# Patient Record
Sex: Male | Born: 1975 | Race: White | Hispanic: No | Marital: Married | Smoking: Former smoker
Health system: Southern US, Community
[De-identification: ages and names within clinical notes are randomized; demographics above are authoritative.]

## PROBLEM LIST (undated history)

## (undated) DIAGNOSIS — Z8619 Personal history of other infectious and parasitic diseases: Secondary | ICD-10-CM

## (undated) DIAGNOSIS — M1990 Unspecified osteoarthritis, unspecified site: Secondary | ICD-10-CM

## (undated) DIAGNOSIS — I219 Acute myocardial infarction, unspecified: Secondary | ICD-10-CM

## (undated) DIAGNOSIS — T7840XA Allergy, unspecified, initial encounter: Secondary | ICD-10-CM

## (undated) DIAGNOSIS — K219 Gastro-esophageal reflux disease without esophagitis: Secondary | ICD-10-CM

## (undated) HISTORY — DX: Allergy, unspecified, initial encounter: T78.40XA

## (undated) HISTORY — DX: Gastro-esophageal reflux disease without esophagitis: K21.9

## (undated) HISTORY — DX: Personal history of other infectious and parasitic diseases: Z86.19

## (undated) HISTORY — DX: Unspecified osteoarthritis, unspecified site: M19.90

---

## 1998-02-22 ENCOUNTER — Emergency Department (HOSPITAL_COMMUNITY): Admission: EM | Admit: 1998-02-22 | Discharge: 1998-02-22 | Payer: Self-pay | Admitting: *Deleted

## 2001-08-19 ENCOUNTER — Emergency Department (HOSPITAL_COMMUNITY): Admission: EM | Admit: 2001-08-19 | Discharge: 2001-08-19 | Payer: Self-pay | Admitting: Emergency Medicine

## 2001-08-19 ENCOUNTER — Encounter: Payer: Self-pay | Admitting: Emergency Medicine

## 2002-04-14 ENCOUNTER — Emergency Department (HOSPITAL_COMMUNITY): Admission: EM | Admit: 2002-04-14 | Discharge: 2002-04-14 | Payer: Self-pay | Admitting: *Deleted

## 2004-01-06 ENCOUNTER — Emergency Department (HOSPITAL_COMMUNITY): Admission: EM | Admit: 2004-01-06 | Discharge: 2004-01-06 | Payer: Self-pay | Admitting: Emergency Medicine

## 2008-10-08 HISTORY — PX: COLONOSCOPY: SHX174

## 2009-06-06 ENCOUNTER — Encounter (INDEPENDENT_AMBULATORY_CARE_PROVIDER_SITE_OTHER): Payer: Self-pay | Admitting: *Deleted

## 2009-06-28 ENCOUNTER — Ambulatory Visit (HOSPITAL_COMMUNITY): Admission: RE | Admit: 2009-06-28 | Discharge: 2009-06-28 | Payer: Self-pay | Admitting: Family Medicine

## 2009-07-11 ENCOUNTER — Encounter (INDEPENDENT_AMBULATORY_CARE_PROVIDER_SITE_OTHER): Payer: Self-pay | Admitting: *Deleted

## 2009-07-12 ENCOUNTER — Ambulatory Visit: Payer: Self-pay | Admitting: Internal Medicine

## 2009-07-12 DIAGNOSIS — R1031 Right lower quadrant pain: Secondary | ICD-10-CM | POA: Insufficient documentation

## 2009-07-12 DIAGNOSIS — R634 Abnormal weight loss: Secondary | ICD-10-CM | POA: Insufficient documentation

## 2009-07-12 DIAGNOSIS — K625 Hemorrhage of anus and rectum: Secondary | ICD-10-CM | POA: Insufficient documentation

## 2009-07-12 DIAGNOSIS — R1012 Left upper quadrant pain: Secondary | ICD-10-CM | POA: Insufficient documentation

## 2009-07-12 DIAGNOSIS — R197 Diarrhea, unspecified: Secondary | ICD-10-CM | POA: Insufficient documentation

## 2009-07-13 ENCOUNTER — Encounter: Payer: Self-pay | Admitting: Gastroenterology

## 2009-07-13 ENCOUNTER — Encounter: Payer: Self-pay | Admitting: Internal Medicine

## 2009-07-14 ENCOUNTER — Encounter: Payer: Self-pay | Admitting: Internal Medicine

## 2009-07-14 ENCOUNTER — Encounter: Payer: Self-pay | Admitting: Gastroenterology

## 2009-07-14 LAB — CONVERTED CEMR LAB: IgA: 99 mg/dL (ref 68–378)

## 2009-07-18 ENCOUNTER — Encounter: Payer: Self-pay | Admitting: Internal Medicine

## 2009-07-18 LAB — CONVERTED CEMR LAB
Tissue Transglutaminase Ab, IgA: 0.1 units (ref <7)
Total CK: 60 units/L (ref 7–232)

## 2009-08-01 ENCOUNTER — Ambulatory Visit (HOSPITAL_COMMUNITY): Admission: RE | Admit: 2009-08-01 | Discharge: 2009-08-01 | Payer: Self-pay | Admitting: Internal Medicine

## 2009-08-01 ENCOUNTER — Ambulatory Visit: Payer: Self-pay | Admitting: Internal Medicine

## 2009-08-01 ENCOUNTER — Encounter: Payer: Self-pay | Admitting: Internal Medicine

## 2009-08-07 ENCOUNTER — Encounter: Payer: Self-pay | Admitting: Internal Medicine

## 2009-08-11 ENCOUNTER — Encounter: Payer: Self-pay | Admitting: Internal Medicine

## 2010-10-30 ENCOUNTER — Encounter: Payer: Self-pay | Admitting: Family Medicine

## 2011-01-11 LAB — FECAL LACTOFERRIN, QUANT: Fecal Lactoferrin: NEGATIVE

## 2011-01-11 LAB — CLOSTRIDIUM DIFFICILE EIA

## 2011-01-11 LAB — OVA AND PARASITE EXAMINATION: Ova and parasites: NONE SEEN

## 2011-01-11 LAB — STOOL CULTURE

## 2011-06-29 IMAGING — CT CT ABDOMEN W/ CM
2 of 4 series · 15 of 46 positions shown, 17 images · IV contrast (omnipaque)
Comparison: None

CT ABDOMEN

CLINICAL DATA: Weight loss, diarrhea and blood in stool.

CT ABDOMEN AND PELVIS WITH CONTRAST
TECHNIQUE: Multidetector CT imaging of the abdomen and pelvis was
performed using the standard protocol following bolus
administration of intravenous contrast.
Contrast: 100 mL Omnipaque 300 IV contrast

[Series 2: abd_pel_with 5.0 b40f · axial · 0.66mm/px · z∈[-556,-122]mm · 12 of 103 slices shown, 14 images]
[im 8/103  soft-tissue]
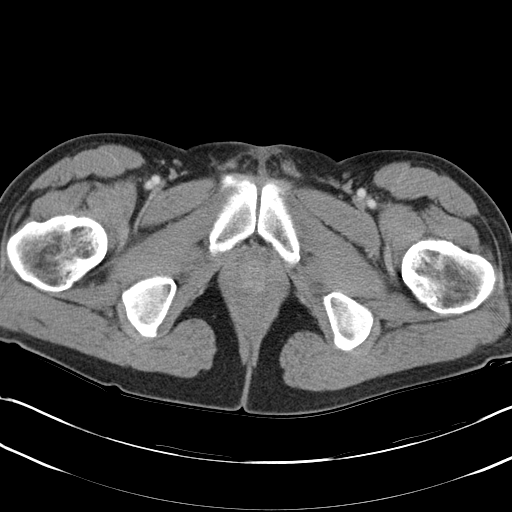
[im 8/103  bone]
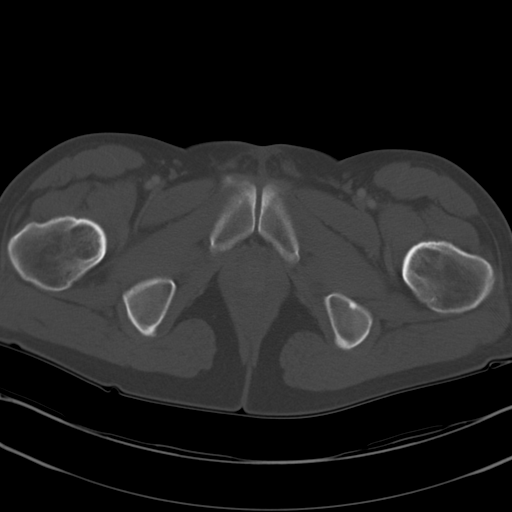
[im 16/103  soft-tissue]
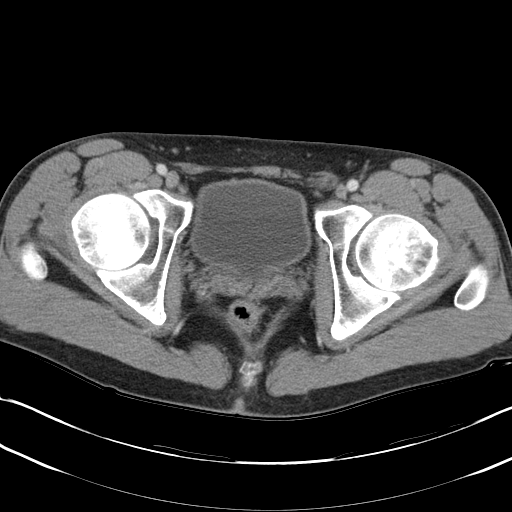
[im 24/103  soft-tissue]
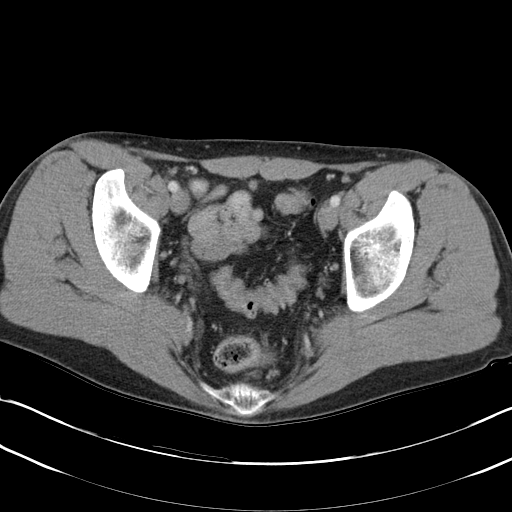
[im 32/103  soft-tissue]
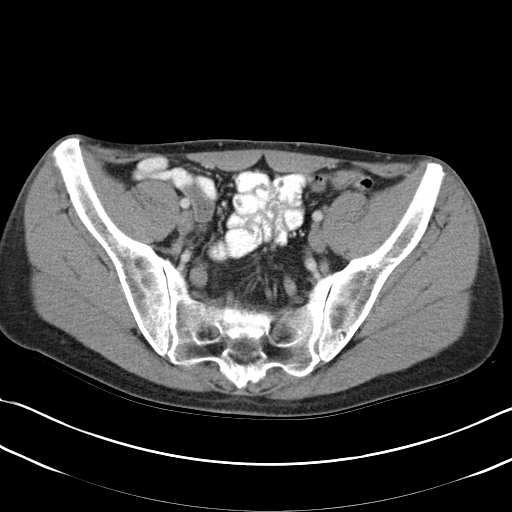
[im 40/103  soft-tissue]
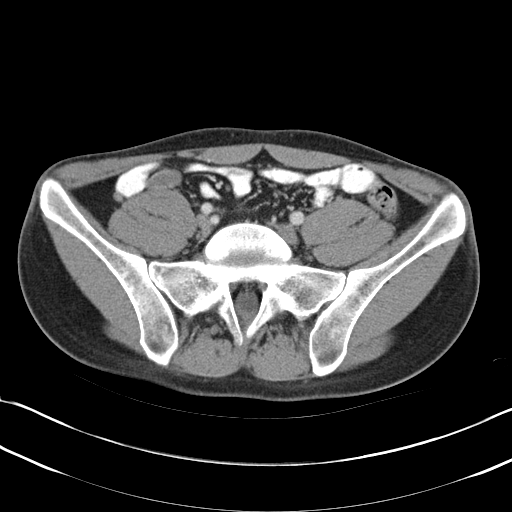
[im 48/103  soft-tissue]
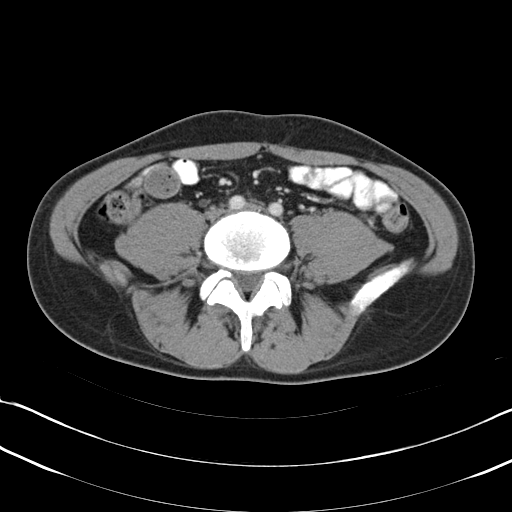
[im 55/103  soft-tissue]
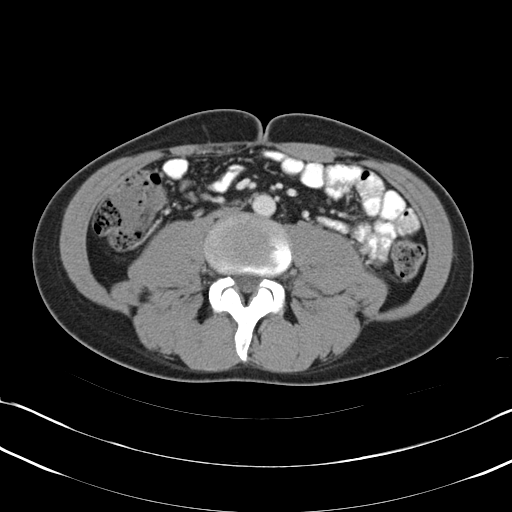
[im 63/103  soft-tissue]
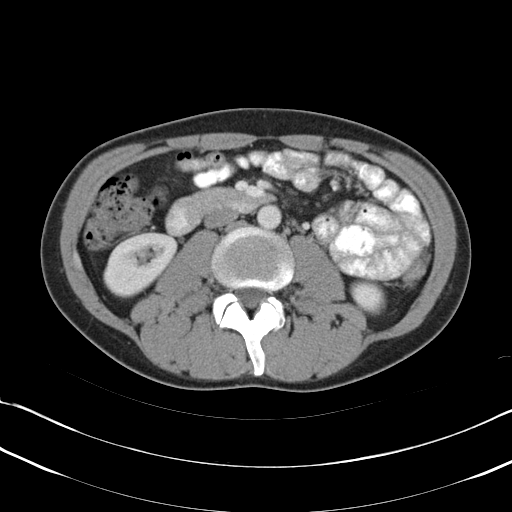
[im 71/103  soft-tissue]
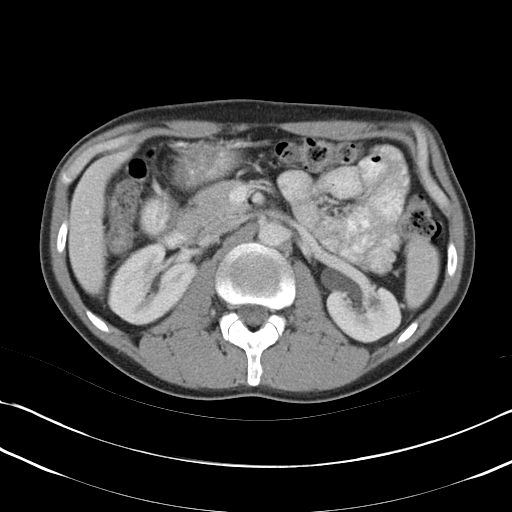
[im 71/103  bone]
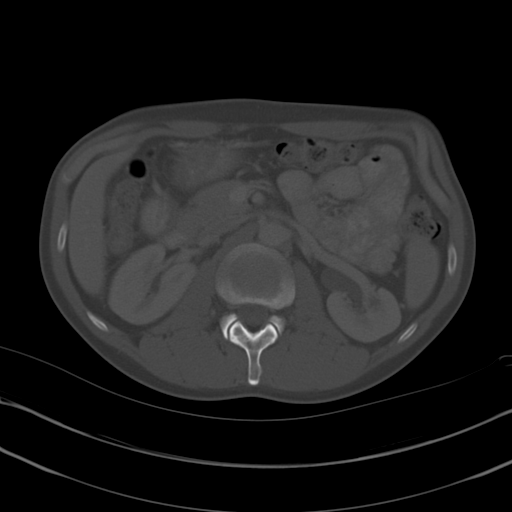
[im 79/103  soft-tissue]
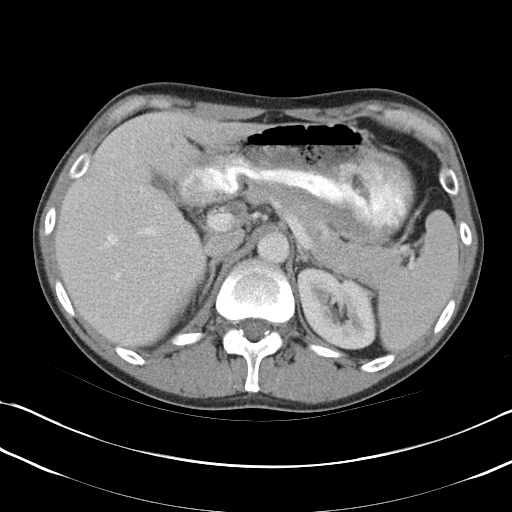
[im 87/103  soft-tissue]
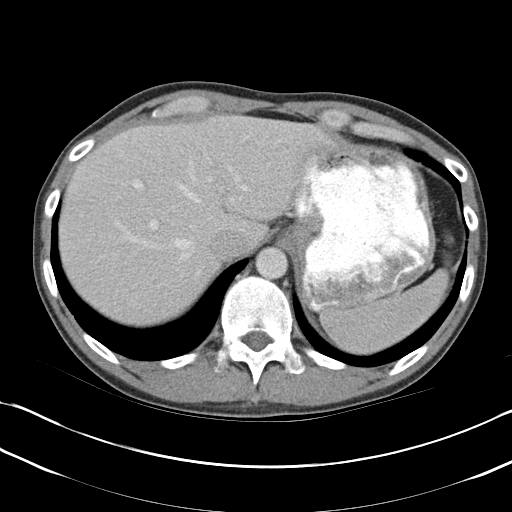
[im 95/103  soft-tissue]
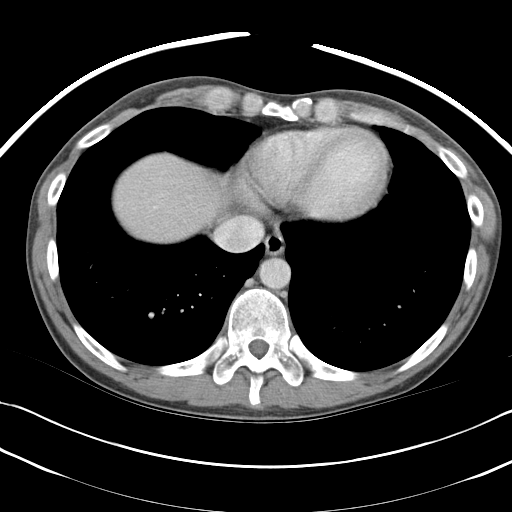

[Series 4: mpr cor post contrast (id) · coronal · 0.65mm/px · 3 of 66 slices shown]
[im 22/66  soft-tissue]
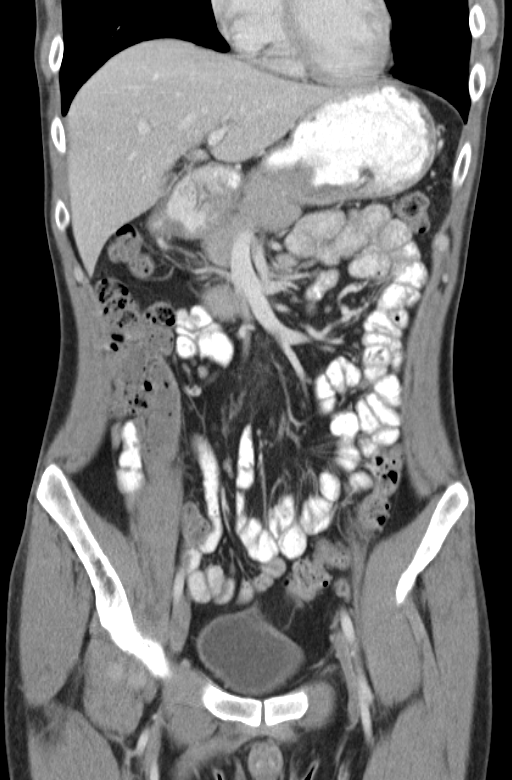
[im 29/66  soft-tissue]
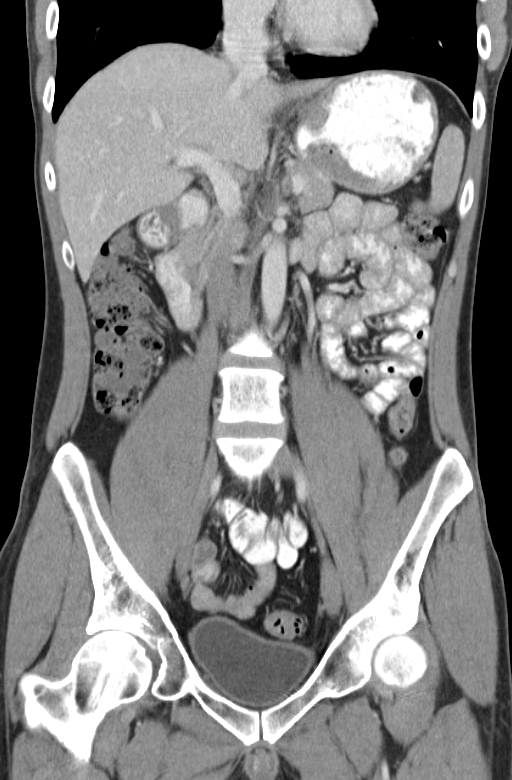
[im 37/66  soft-tissue]
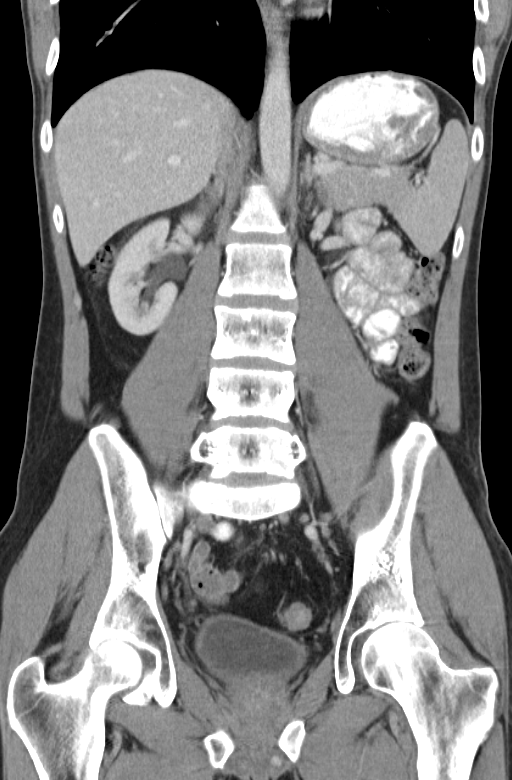

[15 of 46 positions shown; findings below may reference images not displayed]

FINDINGS: The visualized lung bases are clear.

The liver and spleen are unremarkable in appearance.  The
gallbladder is within normal limits.  The pancreas and adrenal
glands are unremarkable.  The kidneys are within normal limits
bilaterally; no hydronephrosis or perinephric stranding is seen. A
small extrarenal pelvis is incidentally noted on each side.

No definite mass is identified.  No free fluid is identified.  The
small bowel is unremarkable in appearance.  The stomach is within
normal limits.  No acute vascular abnormalities are seen.

No acute osseous abnormalities are identified.
IMPRESSION: Unremarkable CT of the abdomen.

CT PELVIS
FINDINGS: The colon is unremarkable in appearance.  No definite
mass is seen.  No abnormal wall thickening is identified.  The
appendix is normal in caliber, without evidence for acute
appendicitis.  No definite abnormalities are seen within the
terminal ileum.  No suspicious lymphadenopathy is seen.  No free
fluid is noted within the pelvis.

The bladder is mildly distended and within normal limits. The
prostate remains normal in size.  There is no evidence of inguinal
lymphadenopathy.

No acute osseous abnormalities are seen.
IMPRESSION: Unremarkable CT of the pelvis.

## 2011-10-09 DIAGNOSIS — I219 Acute myocardial infarction, unspecified: Secondary | ICD-10-CM

## 2011-10-09 HISTORY — DX: Acute myocardial infarction, unspecified: I21.9

## 2012-06-20 ENCOUNTER — Other Ambulatory Visit: Payer: Self-pay | Admitting: Internal Medicine

## 2012-06-20 ENCOUNTER — Encounter: Payer: Self-pay | Admitting: *Deleted

## 2012-06-20 DIAGNOSIS — R079 Chest pain, unspecified: Secondary | ICD-10-CM

## 2012-06-27 ENCOUNTER — Ambulatory Visit (HOSPITAL_COMMUNITY)
Admission: RE | Admit: 2012-06-27 | Discharge: 2012-06-27 | Disposition: A | Discharge status: Home / Self Care | Payer: 59 | Source: Ambulatory Visit | Attending: Internal Medicine | Admitting: Internal Medicine

## 2012-06-27 DIAGNOSIS — R911 Solitary pulmonary nodule: Secondary | ICD-10-CM | POA: Insufficient documentation

## 2012-06-27 DIAGNOSIS — R079 Chest pain, unspecified: Secondary | ICD-10-CM

## 2012-06-27 DIAGNOSIS — R55 Syncope and collapse: Secondary | ICD-10-CM | POA: Insufficient documentation

## 2012-06-27 MED ORDER — NITROGLYCERIN 0.4 MG SL SUBL
SUBLINGUAL_TABLET | SUBLINGUAL | Status: AC
  Administered 2012-06-27: 0.4 mg
  Filled 2012-06-27: qty 25

## 2012-06-27 MED ORDER — METOPROLOL TARTRATE 1 MG/ML IV SOLN
INTRAVENOUS | Status: AC
  Filled 2012-06-27: qty 5

## 2012-06-27 MED ORDER — IOHEXOL 350 MG/ML SOLN
80.0000 mL | Freq: Once | INTRAVENOUS | Status: AC | PRN
  Administered 2012-06-27: 80 mL via INTRAVENOUS

## 2012-06-27 MED ORDER — METOPROLOL TARTRATE 1 MG/ML IV SOLN
5.0000 mg | INTRAVENOUS | Status: DC | PRN
  Administered 2012-06-27: 5 mg via INTRAVENOUS

## 2015-04-10 ENCOUNTER — Encounter (HOSPITAL_COMMUNITY): Payer: Self-pay | Admitting: Emergency Medicine

## 2015-04-10 ENCOUNTER — Emergency Department (HOSPITAL_COMMUNITY): Payer: BLUE CROSS/BLUE SHIELD

## 2015-04-10 ENCOUNTER — Emergency Department (HOSPITAL_COMMUNITY)
Admission: EM | Admit: 2015-04-10 | Discharge: 2015-04-10 | Disposition: A | Discharge status: Home / Self Care | Payer: BLUE CROSS/BLUE SHIELD | Attending: Emergency Medicine | Admitting: Emergency Medicine

## 2015-04-10 DIAGNOSIS — Z79899 Other long term (current) drug therapy: Secondary | ICD-10-CM | POA: Diagnosis not present

## 2015-04-10 DIAGNOSIS — Z87891 Personal history of nicotine dependence: Secondary | ICD-10-CM | POA: Diagnosis not present

## 2015-04-10 DIAGNOSIS — R109 Unspecified abdominal pain: Secondary | ICD-10-CM

## 2015-04-10 DIAGNOSIS — Z880 Allergy status to penicillin: Secondary | ICD-10-CM | POA: Diagnosis not present

## 2015-04-10 DIAGNOSIS — I252 Old myocardial infarction: Secondary | ICD-10-CM | POA: Diagnosis not present

## 2015-04-10 HISTORY — DX: Acute myocardial infarction, unspecified: I21.9

## 2015-04-10 LAB — URINALYSIS, ROUTINE W REFLEX MICROSCOPIC
BILIRUBIN URINE: NEGATIVE
Glucose, UA: NEGATIVE mg/dL
Hgb urine dipstick: NEGATIVE
KETONES UR: NEGATIVE mg/dL
LEUKOCYTES UA: NEGATIVE
Nitrite: NEGATIVE
PROTEIN: NEGATIVE mg/dL
Specific Gravity, Urine: 1.019 (ref 1.005–1.030)
Urobilinogen, UA: 0.2 mg/dL (ref 0.0–1.0)
pH: 6 (ref 5.0–8.0)

## 2015-04-10 LAB — I-STAT TROPONIN, ED: Troponin i, poc: 0 ng/mL (ref 0.00–0.08)

## 2015-04-10 MED ORDER — HYDROCODONE-ACETAMINOPHEN 5-325 MG PO TABS: 1.0000 | ORAL_TABLET | Freq: Four times a day (QID) | ORAL | Status: DC | PRN

## 2015-04-10 MED ORDER — SODIUM CHLORIDE 0.9 % IV SOLN
INTRAVENOUS | Status: DC
  Administered 2015-04-10: 125 mL/h via INTRAVENOUS

## 2015-04-10 MED ORDER — ONDANSETRON HCL 4 MG/2ML IJ SOLN
4.0000 mg | Freq: Once | INTRAMUSCULAR | Status: AC
  Administered 2015-04-10: 4 mg via INTRAVENOUS
  Filled 2015-04-10: qty 2

## 2015-04-10 MED ORDER — HYDROMORPHONE HCL 1 MG/ML IJ SOLN
1.0000 mg | Freq: Once | INTRAMUSCULAR | Status: AC
  Administered 2015-04-10: 1 mg via INTRAVENOUS
  Filled 2015-04-10: qty 1

## 2015-04-10 NOTE — ED Notes (Signed)
Pt reports right lower flank pain starting last night. Familial history of kidney stones. Dysuria noted today. Took 350mg  of Soma and 100 mg Tramadol around 2100 last night. No alleviation of pain. Denies N/V/D. No other c/c.

## 2015-04-10 NOTE — ED Provider Notes (Addendum)
CSN: [id number]     Arrival date & time 04/10/15  0051 History   First MD Initiated Contact with Patient 04/10/15 0205     Chief Complaint  Patient presents with  . Flank Pain     (Consider location/radiation/quality/duration/timing/severity/associated sxs/prior Treatment) HPI  This is a 39 year old male with a family history of kidney stones. He is here with right flank pain that started the evening before yesterday. The onset was gradual and has now become severe. It is somewhat worse with movement but he cannot find a comfortable position. The pain radiates into his right groin. It is a sharp pain. He has had difficulty urinating and has not noted any hematuria. He denies nausea, vomiting and diarrhea. He took 350 milligrams as some 100 milligrams of Ultram yesterday evening about 9 PM without relief.  Past Medical History  Diagnosis Date  . MI (myocardial infarction) 2013   History reviewed. No pertinent past surgical history. History reviewed. No pertinent family history. History  Substance Use Topics  . Smoking status: Former Games developer  . Smokeless tobacco: Not on file  . Alcohol Use: No    Review of Systems  All other systems reviewed and are negative.   Allergies  Penicillins  Home Medications   Prior to Admission medications   Medication Sig Start Date End Date Taking? Authorizing Provider  carisoprodol (SOMA) 350 MG tablet Take 350 mg by mouth 4 (four) times daily as needed for muscle spasms.   Yes Historical Provider, MD  ibuprofen (ADVIL,MOTRIN) 200 MG tablet Take 800 mg by mouth every 6 (six) hours as needed for moderate pain.   Yes Historical Provider, MD  omeprazole (PRILOSEC) 20 MG capsule Take 20 mg by mouth daily.   Yes Historical Provider, MD  traMADol (ULTRAM) 50 MG tablet Take 100 mg by mouth every 6 (six) hours as needed for moderate pain.   Yes Historical Provider, MD   BP 122/67 mmHg  Pulse 56  Temp(Src) 98.6 F (37 C) (Oral)  Resp 18  Ht  (1.88  m)  Wt 200 lb (90.719 kg)  BMI 25.67 kg/m2  SpO2 100%   Physical Exam  General: Well-developed, well-nourished male in no acute distress; appearance consistent with age of record HENT: normocephalic; atraumatic Eyes: pupils equal, round and reactive to light; extraocular muscles intact Neck: supple Heart: regular rate and rhythm Lungs: clear to auscultation bilaterally Abdomen: soft; nondistended; equivocal right lower quadrant tenderness; no masses or hepatosplenomegaly; bowel sounds present GU: Mild right CVA tenderness Extremities: No deformity; full range of motion; pulses normal Neurologic: Awake, alert and oriented; motor function intact in all extremities and symmetric; no facial droop Skin: Warm and dry Psychiatric: Normal mood and affect    ED Course  Procedures (including critical care time)   MDM  Nursing notes and vitals signs, including pulse oximetry, reviewed.  Summary of this visit's results, reviewed by myself:  Labs:  Results for orders placed or performed during the hospital encounter of 04/10/15 (from the past 24 hour(s))  I-Stat Troponin, ED (not at [facility])     Status: None   Collection Time: 04/10/15  2:08 AM  Result Value Ref Range   Troponin i, poc 0.00 0.00 - 0.08 ng/mL   Comment 3          Urinalysis, Routine w reflex microscopic (not at [facility])     Status: None   Collection Time: 04/10/15  4:10 AM  Result Value Ref Range   Color, Urine YELLOW YELLOW  APPearance CLEAR CLEAR   Specific Gravity, Urine 1.019 1.005 - 1.030   pH 6.0 5.0 - 8.0   Glucose, UA NEGATIVE NEGATIVE mg/dL   Hgb urine dipstick NEGATIVE NEGATIVE   Bilirubin Urine NEGATIVE NEGATIVE   Ketones, ur NEGATIVE NEGATIVE mg/dL   Protein, ur NEGATIVE NEGATIVE mg/dL   Urobilinogen, UA 0.2 0.0 - 1.0 mg/dL   Nitrite NEGATIVE NEGATIVE   Leukocytes, UA NEGATIVE NEGATIVE    Imaging Studies: Ct Renal Stone Study  04/10/2015   CLINICAL DATA:  Right flank pain.  Onset last night.  EXAM: CT  ABDOMEN AND PELVIS WITHOUT CONTRAST  TECHNIQUE: Multidetector CT imaging of the abdomen and pelvis was performed following the standard protocol without IV contrast.  COMPARISON:  None.  FINDINGS: There are no urinary calculi. There is no hydronephrosis or ureteral dilatation. The appendix is normal. Remainder the bowel is remarkable only for mild colonic diverticulosis. There are unremarkable unenhanced appearances of the liver, spleen, pancreas and adrenals. The abdominal aorta is normal in caliber. There is no atherosclerotic calcification. There is no adenopathy in the abdomen or pelvis.  No acute inflammatory changes are evident in the abdomen or pelvis. There is no ascites.  There is no significant abnormality in the lower chest. There is no significant musculoskeletal abnormality.  IMPRESSION: 1. No acute findings are evident in the abdomen or pelvis. 2. Mild diverticulosis   Electronically Signed   By: Ellery Plunk M.D.   On: 04/10/2015 02:54   6:45 AM Patient advised of unremarkable CT and urinalysis findings. Presentation was suggestive of a kidney stone, and pain may represent a recently passed stone, but more likely this is musculoskeletal or radicular pain. He states his pain is returning at the present time and is worse with movement. He is tender below the usual location of kidney tenderness. He states he has a primary care physician with whom he can follow-up if symptoms persist. He was advised an MRI may be indicated to evaluate for radicular disease.  Paula Libra, MD 04/10/15 [id number]  Paula Libra, MD 04/10/15 [phone]

## 2015-04-10 NOTE — ED Notes (Signed)
Patient transported to CT 

## 2015-04-10 NOTE — ED Notes (Addendum)
Pt cannot use rest room at this time, aware urine sample is needed 

## 2015-04-10 NOTE — Discharge Instructions (Signed)
Flank Pain °Flank pain refers to pain that is located on the side of the body between the upper abdomen and the back. The pain may occur over a short period of time (acute) or may be long-term or reoccurring (chronic). It may be mild or severe. Flank pain can be caused by many things. °CAUSES  °Some of the more common causes of flank pain include: °· Muscle strains.   °· Muscle spasms.   °· A disease of your spine (vertebral disk disease).   °· A lung infection (pneumonia).   °· Fluid around your lungs (pulmonary edema).   °· A kidney infection.   °· Kidney stones.   °· A very painful skin rash caused by the chickenpox virus (shingles).   °· Gallbladder disease.   °HOME CARE INSTRUCTIONS  °Home care will depend on the cause of your pain. In general, °· Rest as directed by your caregiver. °· Drink enough fluids to keep your urine clear or pale yellow. °· Only take over-the-counter or prescription medicines as directed by your caregiver. Some medicines may help relieve the pain. °· Tell your caregiver about any changes in your pain. °· Follow up with your caregiver as directed. °SEEK IMMEDIATE MEDICAL CARE IF:  °· Your pain is not controlled with medicine.   °· You have new or worsening symptoms. °· Your pain increases.   °· You have abdominal pain.   °· You have shortness of breath.   °· You have persistent nausea or vomiting.   °· You have swelling in your abdomen.   °· You feel faint or pass out.   °· You have blood in your urine. °· You have a fever or persistent symptoms for more than 2-3 days. °· You have a fever and your symptoms suddenly get worse. °MAKE SURE YOU:  °· Understand these instructions. °· Will watch your condition. °· Will get help right away if you are not doing well or get worse. °Document Released: 11/15/2005 Document Revised: 06/18/2012 Document Reviewed: 05/08/2012 °ExitCare® Patient Information ©2015 [employer]. This information is not intended to replace advice given to you by your  health care provider. Make sure you discuss any questions you have with your health care provider. ° °

## 2015-05-17 ENCOUNTER — Ambulatory Visit (HOSPITAL_COMMUNITY)
Admission: RE | Admit: 2015-05-17 | Discharge: 2015-05-17 | Disposition: A | Discharge status: Home / Self Care | Payer: BLUE CROSS/BLUE SHIELD | Source: Ambulatory Visit | Attending: Orthopedic Surgery | Admitting: Orthopedic Surgery

## 2015-05-17 ENCOUNTER — Other Ambulatory Visit (HOSPITAL_COMMUNITY): Payer: Self-pay | Admitting: Orthopedic Surgery

## 2015-05-17 DIAGNOSIS — Z139 Encounter for screening, unspecified: Secondary | ICD-10-CM

## 2015-05-17 DIAGNOSIS — Z1389 Encounter for screening for other disorder: Secondary | ICD-10-CM | POA: Insufficient documentation

## 2018-12-12 DIAGNOSIS — M79642 Pain in left hand: Secondary | ICD-10-CM | POA: Diagnosis not present

## 2018-12-12 DIAGNOSIS — M79641 Pain in right hand: Secondary | ICD-10-CM | POA: Diagnosis not present

## 2019-02-07 DIAGNOSIS — S61311A Laceration without foreign body of left index finger with damage to nail, initial encounter: Secondary | ICD-10-CM | POA: Diagnosis not present

## 2019-02-07 DIAGNOSIS — Z87891 Personal history of nicotine dependence: Secondary | ICD-10-CM | POA: Diagnosis not present

## 2019-02-07 DIAGNOSIS — Z23 Encounter for immunization: Secondary | ICD-10-CM | POA: Diagnosis not present

## 2019-02-07 DIAGNOSIS — S6992XA Unspecified injury of left wrist, hand and finger(s), initial encounter: Secondary | ICD-10-CM | POA: Diagnosis not present

## 2019-02-07 DIAGNOSIS — S61211A Laceration without foreign body of left index finger without damage to nail, initial encounter: Secondary | ICD-10-CM | POA: Diagnosis not present

## 2019-02-07 DIAGNOSIS — G8911 Acute pain due to trauma: Secondary | ICD-10-CM | POA: Diagnosis not present

## 2019-02-07 DIAGNOSIS — W312XXA Contact with powered woodworking and forming machines, initial encounter: Secondary | ICD-10-CM | POA: Diagnosis not present

## 2019-02-07 DIAGNOSIS — S62661B Nondisplaced fracture of distal phalanx of left index finger, initial encounter for open fracture: Secondary | ICD-10-CM | POA: Diagnosis not present

## 2019-02-07 DIAGNOSIS — Z880 Allergy status to penicillin: Secondary | ICD-10-CM | POA: Diagnosis not present

## 2019-02-07 DIAGNOSIS — I252 Old myocardial infarction: Secondary | ICD-10-CM | POA: Diagnosis not present

## 2019-02-09 DIAGNOSIS — M79642 Pain in left hand: Secondary | ICD-10-CM | POA: Diagnosis not present

## 2019-02-19 DIAGNOSIS — M79642 Pain in left hand: Secondary | ICD-10-CM | POA: Diagnosis not present

## 2019-03-12 DIAGNOSIS — M79642 Pain in left hand: Secondary | ICD-10-CM | POA: Diagnosis not present

## 2019-10-20 DIAGNOSIS — J019 Acute sinusitis, unspecified: Secondary | ICD-10-CM | POA: Diagnosis not present

## 2020-04-22 DIAGNOSIS — M1712 Unilateral primary osteoarthritis, left knee: Secondary | ICD-10-CM | POA: Diagnosis not present

## 2020-05-26 ENCOUNTER — Ambulatory Visit: Payer: Self-pay | Admitting: Family Medicine

## 2020-06-17 ENCOUNTER — Telehealth: Payer: Self-pay | Admitting: Physician Assistant

## 2020-06-17 ENCOUNTER — Ambulatory Visit (INDEPENDENT_AMBULATORY_CARE_PROVIDER_SITE_OTHER): Payer: BC Managed Care – PPO | Admitting: Physician Assistant

## 2020-06-17 ENCOUNTER — Other Ambulatory Visit: Payer: Self-pay

## 2020-06-17 ENCOUNTER — Encounter: Payer: Self-pay | Admitting: Physician Assistant

## 2020-06-17 VITALS — BP 140/90 | HR 77 | Temp 97.8°F | Ht 73.5 in | Wt 209.2 lb

## 2020-06-17 DIAGNOSIS — K529 Noninfective gastroenteritis and colitis, unspecified: Secondary | ICD-10-CM

## 2020-06-17 DIAGNOSIS — B0230 Zoster ocular disease, unspecified: Secondary | ICD-10-CM | POA: Diagnosis not present

## 2020-06-17 DIAGNOSIS — I219 Acute myocardial infarction, unspecified: Secondary | ICD-10-CM | POA: Diagnosis not present

## 2020-06-17 DIAGNOSIS — M1990 Unspecified osteoarthritis, unspecified site: Secondary | ICD-10-CM | POA: Insufficient documentation

## 2020-06-17 DIAGNOSIS — K219 Gastro-esophageal reflux disease without esophagitis: Secondary | ICD-10-CM | POA: Diagnosis not present

## 2020-06-17 DIAGNOSIS — Z0000 Encounter for general adult medical examination without abnormal findings: Secondary | ICD-10-CM

## 2020-06-17 DIAGNOSIS — Z1159 Encounter for screening for other viral diseases: Secondary | ICD-10-CM | POA: Diagnosis not present

## 2020-06-17 DIAGNOSIS — Z114 Encounter for screening for human immunodeficiency virus [HIV]: Secondary | ICD-10-CM

## 2020-06-17 DIAGNOSIS — F5109 Other insomnia not due to a substance or known physiological condition: Secondary | ICD-10-CM

## 2020-06-17 DIAGNOSIS — Z8042 Family history of malignant neoplasm of prostate: Secondary | ICD-10-CM

## 2020-06-17 MED ORDER — VALACYCLOVIR HCL 1 G PO TABS: 1000.0000 mg | ORAL_TABLET | Freq: Every day | ORAL | 1 refills | Status: DC

## 2020-06-17 MED ORDER — OMEPRAZOLE 20 MG PO CPDR: 20.0000 mg | DELAYED_RELEASE_CAPSULE | Freq: Every day | ORAL | 1 refills | Status: DC

## 2020-06-17 NOTE — Progress Notes (Signed)
Garrett Caldwell is a 44 y.o. male here to Establish care and annual physical.  I acted as a Neurosurgeon for [employer], PA-C Corky Mull, LPN   History of Present Illness:   Chief Complaint  Patient presents with  . Establish Care  . Annual Exam    Acute Concerns: None  Chronic Issues: MI -- happened in 2013. He was admitted and saw cardiology. States that he was told multiple different things by different cardiologist.  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 Takes 1 gram valtrex daily for this. GERD -- currently takes prilosec 20 mg daily. He does well with this. Has been on this for several years. Insomnia -- was situational after dad died. Will occassionally take this. Colitis -- was told that he possibly has Crohns. Saw [facility] in 2010. Manages his symptoms with his diet. Has had colonoscopy and EGD.  Health Maintenance: Immunizations -- UTD Colonoscopy -- N/A PSA -- done last year at [facility] -- said "slightly elevated" I do not have record of this Diet -- variable; does drink significant amount of juices and kool-aid Sleep habits -- will take tyelnol pm and Exercise -- none Weight -- Weight: 209 lb 4 oz (94.9 kg)  Mood -- overall good Alcohol -- none Tobacco -- former smoker  Depression screen [facility] 2/9 06/17/2020  Decreased Interest 0  Down, Depressed, Hopeless 0  PHQ - 2 Score 0    No flowsheet data found.   Other providers/specialists: Patient Care Team: Jarold Motto, [place] as PCP - General (Physician Assistant)   Past Medical History:  Diagnosis Date  . Allergy   . Arthritis   . GERD (gastroesophageal reflux disease)   . History of chicken pox   . History of shingles   . MI (myocardial infarction) (HCC) 2013     Social History   Tobacco Use  . Smoking status: Former Games developer  . Smokeless tobacco: Never Used  . Tobacco comment: quit 2017  Vaping Use  . Vaping Use: Never used  Substance Use Topics  . Alcohol  use: Never  . Drug use: Never    History reviewed. No pertinent surgical history.  Family History  Problem Relation Age of Onset  . Prostate cancer Father        mets to body    Allergies  Allergen Reactions  . Bee Venom Anaphylaxis  . Penicillins Hives and Swelling     Current Medications:   Current Outpatient Medications:  .  ibuprofen (ADVIL,MOTRIN) 200 MG tablet, Take 800 mg by mouth every 6 (six) hours as needed for moderate pain., Disp: , Rfl:  .  omeprazole (PRILOSEC) 20 MG capsule, Take 1 capsule (20 mg total) by mouth daily., Disp: 90 capsule, Rfl: 1 .  traMADol (ULTRAM) 50 MG tablet, tramadol 50 mg tablet, Disp: , Rfl:  .  traZODone (DESYREL) 50 MG tablet, trazodone 50 mg tablet, Disp: , Rfl:  .  valACYclovir (VALTREX) 1000 MG tablet, Take 1 tablet (1,000 mg total) by mouth daily., Disp: 90 tablet, Rfl: 1   Review of Systems:   Review of Systems  Constitutional: Negative for chills, fever, malaise/fatigue and weight loss.  HENT: Negative for hearing loss, sinus pain and sore throat.   Respiratory: Negative for cough and hemoptysis.   Cardiovascular: Negative for chest pain, palpitations, leg swelling and PND.  Gastrointestinal: Negative for abdominal pain, constipation, diarrhea, heartburn, nausea and vomiting.  Genitourinary: Negative for dysuria, frequency and urgency.  Musculoskeletal: Negative  for back pain, myalgias and neck pain.  Skin: Negative for itching and rash.  Neurological: Negative for dizziness, tingling, seizures and headaches.  Endo/Heme/Allergies: Negative for polydipsia.  Psychiatric/Behavioral: Negative for depression. The patient is not nervous/anxious.     Vitals:   Vitals:   06/17/20 0944  BP: 140/90  Pulse: 77  Temp: 97.8 F (36.6 C)  TempSrc: Temporal  SpO2: 99%  Weight: 209 lb 4 oz (94.9 kg)  Height: 6' 1.5" (1.867 m)      Body mass index is 27.23 kg/m.  Physical Exam:   Physical Exam Vitals and nursing note reviewed.   Constitutional:      General: He is not in acute distress.    Appearance: Normal appearance. He is well-developed. He is not ill-appearing or toxic-appearing.  HENT:     Head: Normocephalic and atraumatic.     Right Ear: Tympanic membrane normal. Tympanic membrane is not erythematous, retracted or bulging.     Left Ear: Tympanic membrane normal. Tympanic membrane is not erythematous, retracted or bulging.     Nose: Nose normal.     Mouth/Throat:     Pharynx: Uvula midline.  Eyes:     General: Lids are normal.     Conjunctiva/sclera: Conjunctivae normal.     Pupils: Pupils are equal, round, and reactive to light.  Neck:     Trachea: Trachea normal.  Cardiovascular:     Rate and Rhythm: Normal rate and regular rhythm.     Pulses: Normal pulses.     Heart sounds: Normal heart sounds, S1 normal and S2 normal.  Pulmonary:     Effort: Pulmonary effort is normal.     Breath sounds: Normal breath sounds.  Abdominal:     General: Bowel sounds are normal.     Tenderness: There is no abdominal tenderness.  Lymphadenopathy:     Cervical: No cervical adenopathy.  Skin:    General: Skin is warm and dry.  Neurological:     Mental Status: He is alert.  Psychiatric:        Speech: Speech normal.        Behavior: Behavior normal. Behavior is cooperative.        Thought Content: Thought content normal.     Assessment and Plan:   Sally was seen today for establish care and annual exam.  Diagnoses and all orders for this visit:  Routine physical examination Today patient counseled on age appropriate routine health concerns for screening and prevention, each reviewed and up to date or declined. Immunizations reviewed and up to date or declined. Labs ordered and reviewed. Risk factors for depression reviewed and negative. Hearing function and visual acuity are intact. ADLs screened and addressed as needed. Functional ability and level of safety reviewed and appropriate. Education,  counseling and referrals performed based on assessed risks today. Patient provided with a copy of personalized plan for preventive services.  Myocardial infarction, unspecified MI type, unspecified artery (HCC) Declines referral to cardiology for routine follow-up. Will update lipid panel today. Denies symptoms. Understands need to follow-up ASAP if symptoms develop. 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 Courtesy refill of Valtrex given today.  Gastroesophageal reflux disease, unspecified whether esophagitis  present Controlled with Prilosec. No red flags. Refill today.  Inflammatory bowel disease Declines GI work-up. Understands that he needs colonoscopy at age 45. Continue to monitor.  Situational insomnia Trazodone prn works well for him. Continue to monitor.  Family history of prostate cancer No symptoms currently. Will update PSA. Requesting prior PSA from prior PCP. -     PSA; Future -     PSA  Screening for HIV (human immunodeficiency virus) -     HIV Antibody (routine testing w rflx); Future -     HIV Antibody (routine testing w rflx)  Encounter for screening for other viral diseases -     Hepatitis C antibody; Future -     Hepatitis C antibody  Other orders -     valACYclovir (VALTREX) 1000 MG tablet; Take 1 tablet (1,000 mg total) by mouth daily. -     omeprazole (PRILOSEC) 20 MG capsule; Take 1 capsule (20 mg total) by mouth daily.  . Reviewed expectations re: course of current medical issues. . Discussed self-management of symptoms. . Outlined signs and symptoms indicating need for more acute intervention. . Patient verbalized understanding and all questions were answered. . See orders for this visit as documented in the  electronic medical record. . Patient received an After-Visit Summary.  CMA or LPN served as scribe during this visit. History, Physical, and Plan performed by medical provider. The above documentation has been reviewed and is accurate and complete.   Jarold Motto, PA-C

## 2020-06-17 NOTE — Telephone Encounter (Signed)
Rx's were sent in as 90 day supply.

## 2020-06-17 NOTE — Patient Instructions (Signed)
It was great to see you!  Please go to the lab for blood work.   Refills for Valtrex and Omeprazole have been sent.  Our office will call you with your results unless you have chosen to receive results via MyChart.  If your blood work is normal we will follow-up each year for physicals and as scheduled for chronic medical problems.  If anything is abnormal we will treat accordingly and get you in for a follow-up.  Take care,  [facility] Maintenance, Male Adopting a healthy lifestyle and getting preventive care are important in promoting health and wellness. Ask your health care provider about:  The right schedule for you to have regular tests and exams.  Things you can do on your own to prevent diseases and keep yourself healthy. What should I know about diet, weight, and exercise? Eat a healthy diet   Eat a diet that includes plenty of vegetables, fruits, low-fat dairy products, and lean protein.  Do not eat a lot of foods that are high in solid fats, added sugars, or sodium. Maintain a healthy weight Body mass index (BMI) is a measurement that can be used to identify possible weight problems. It estimates body fat based on height and weight. Your health care provider can help determine your BMI and help you achieve or maintain a healthy weight. Get regular exercise Get regular exercise. This is one of the most important things you can do for your health. Most adults should:  Exercise for at least 150 minutes each week. The exercise should increase your heart rate and make you sweat (moderate-intensity exercise).  Do strengthening exercises at least twice a week. This is in addition to the moderate-intensity exercise.  Spend less time sitting. Even light physical activity can be beneficial. Watch cholesterol and blood lipids Have your blood tested for lipids and cholesterol at 46 years of age, then have this test every 5 years. You may need to have your cholesterol  levels checked more often if:  Your lipid or cholesterol levels are high.  You are older than 46 years of age.  You are at high risk for heart disease. What should I know about cancer screening? Many types of cancers can be detected early and may often be prevented. Depending on your health history and family history, you may need to have cancer screening at various ages. This may include screening for:  Colorectal cancer.  Prostate cancer.  Skin cancer.  Lung cancer. What should I know about heart disease, diabetes, and high blood pressure? Blood pressure and heart disease  High blood pressure causes heart disease and increases the risk of stroke. This is more likely to develop in people who have high blood pressure readings, are of African descent, or are overweight.  Talk with your health care provider about your target blood pressure readings.  Have your blood pressure checked: ? Every 3-5 years if you are 26-51 years of age. ? Every year if you are 48 years old or older.  If you are between the ages of 69 and 49 and are a current or former smoker, ask your health care provider if you should have a one-time screening for abdominal aortic aneurysm (AAA). Diabetes Have regular diabetes screenings. This checks your fasting blood sugar level. Have the screening done:  Once every three years after age 6 if you are at a normal weight and have a low risk for diabetes.  More often and at a younger age  if you are overweight or have a high risk for diabetes. What should I know about preventing infection? Hepatitis B If you have a higher risk for hepatitis B, you should be screened for this virus. Talk with your health care provider to find out if you are at risk for hepatitis B infection. Hepatitis C Blood testing is recommended for:  Everyone born from [birth year] through 1967.  Anyone with known risk factors for hepatitis C. Sexually transmitted infections (STIs)  You should be  screened each year for STIs, including gonorrhea and chlamydia, if: ? You are sexually active and are younger than 46 years of age. ? You are older than 46 years of age and your health care provider tells you that you are at risk for this type of infection. ? Your sexual activity has changed since you were last screened, and you are at increased risk for chlamydia or gonorrhea. Ask your health care provider if you are at risk.  Ask your health care provider about whether you are at high risk for HIV. Your health care provider may recommend a prescription medicine to help prevent HIV infection. If you choose to take medicine to prevent HIV, you should first get tested for HIV. You should then be tested every 3 months for as long as you are taking the medicine. Follow these instructions at home: Lifestyle  Do not use any products that contain nicotine or tobacco, such as cigarettes, e-cigarettes, and chewing tobacco. If you need help quitting, ask your health care provider.  Do not use street drugs.  Do not share needles.  Ask your health care provider for help if you need support or information about quitting drugs. Alcohol use  Do not drink alcohol if your health care provider tells you not to drink.  If you drink alcohol: ? Limit how much you have to 0-2 drinks a day. ? Be aware of how much alcohol is in your drink. In the [place] one drink equals one 12 oz bottle of beer (355 mL), one 5 oz glass of wine (148 mL), or one 1 oz glass of hard liquor (44 mL). General instructions  Schedule regular health, dental, and eye exams.  Stay current with your vaccines.  Tell your health care provider if: ? You often feel depressed. ? You have ever been abused or do not feel safe at home. Summary  Adopting a healthy lifestyle and getting preventive care are important in promoting health and wellness.  Follow your health care provider's instructions about healthy diet, exercising, and getting  tested or screened for diseases.  Follow your health care provider's instructions on monitoring your cholesterol and blood pressure. This information is not intended to replace advice given to you by your health care provider. Make sure you discuss any questions you have with your health care provider. Document Revised: 09/17/2018 Document Reviewed: 09/17/2018 Elsevier Patient Education  2020 [employer].

## 2020-06-17 NOTE — Telephone Encounter (Signed)
Patient states his insurance will only cover 90 day supply instead of 30 day supply when filling his RXs

## 2020-06-20 LAB — HIV ANTIBODY (ROUTINE TESTING W REFLEX): HIV 1&2 Ab, 4th Generation: NONREACTIVE

## 2020-06-20 LAB — COMPREHENSIVE METABOLIC PANEL
AG Ratio: 2.1 (calc) (ref 1.0–2.5)
ALT: 15 U/L (ref 9–46)
AST: 16 U/L (ref 10–40)
Albumin: 4.4 g/dL (ref 3.6–5.1)
Alkaline phosphatase (APISO): 99 U/L (ref 36–130)
BUN: 12 mg/dL (ref 7–25)
CO2: 30 mmol/L (ref 20–32)
Calcium: 9.4 mg/dL (ref 8.6–10.3)
Chloride: 105 mmol/L (ref 98–110)
Creat: 1.09 mg/dL (ref 0.60–1.35)
Globulin: 2.1 g/dL (calc) (ref 1.9–3.7)
Glucose, Bld: 84 mg/dL (ref 65–99)
Potassium: 4.3 mmol/L (ref 3.5–5.3)
Sodium: 143 mmol/L (ref 135–146)
Total Bilirubin: 0.4 mg/dL (ref 0.2–1.2)
Total Protein: 6.5 g/dL (ref 6.1–8.1)

## 2020-06-20 LAB — CBC WITH DIFFERENTIAL/PLATELET
Absolute Monocytes: 743 cells/uL (ref 200–950)
Basophils Absolute: 72 cells/uL (ref 0–200)
Basophils Relative: 1.3 %
Eosinophils Absolute: 72 cells/uL (ref 15–500)
Eosinophils Relative: 1.3 %
HCT: 43.8 % (ref 38.5–50.0)
Hemoglobin: 14.3 g/dL (ref 13.2–17.1)
Lymphs Abs: 1777 cells/uL (ref 850–3900)
MCH: 30 pg (ref 27.0–33.0)
MCHC: 32.6 g/dL (ref 32.0–36.0)
MCV: 92 fL (ref 80.0–100.0)
MPV: 12 fL (ref 7.5–12.5)
Monocytes Relative: 13.5 %
Neutro Abs: 2838 cells/uL (ref 1500–7800)
Neutrophils Relative %: 51.6 %
Platelets: 194 10*3/uL (ref 140–400)
RBC: 4.76 10*6/uL (ref 4.20–5.80)
RDW: 12.1 % (ref 11.0–15.0)
Total Lymphocyte: 32.3 %
WBC: 5.5 10*3/uL (ref 3.8–10.8)

## 2020-06-20 LAB — LIPID PANEL
Cholesterol: 222 mg/dL — ABNORMAL HIGH (ref <200)
HDL: 44 mg/dL (ref >=40)
LDL Cholesterol (Calc): 152 mg/dL (calc) — ABNORMAL HIGH
Non-HDL Cholesterol (Calc): 178 mg/dL (calc) — ABNORMAL HIGH (ref <130)
Total CHOL/HDL Ratio: 5 (calc) — ABNORMAL HIGH (ref <5.0)
Triglycerides: 140 mg/dL (ref <150)

## 2020-06-20 LAB — PSA: PSA: 0.7 ng/mL (ref <=4.0)

## 2020-06-20 LAB — HEPATITIS C ANTIBODY
Hepatitis C Ab: NONREACTIVE
SIGNAL TO CUT-OFF: 0 (ref <1.00)

## 2020-07-14 ENCOUNTER — Telehealth: Payer: Self-pay

## 2020-07-14 NOTE — Telephone Encounter (Signed)
Patient's wife is calling in asking if someone could give them a call about lab results as Hessie Diener did not understand, did speak with husband and did advise per DPR that we would not be able to speak about his results so he asked if someone could call him.

## 2020-07-15 NOTE — Telephone Encounter (Signed)
Called pt asked him how can I help you. Pt said you called me. Told him you called about lab results. Pt said need to talk to my wife I told them that yesterday. Told him she is not on DPR do you give me verbal permission to talk to her. Pt said yes.

## 2020-07-15 NOTE — Telephone Encounter (Signed)
Tried to call wife on home # not working at present.

## 2020-07-27 MED ORDER — PRAVASTATIN SODIUM 20 MG PO TABS: 20.0000 mg | ORAL_TABLET | Freq: Every day | ORAL | 1 refills | Status: DC

## 2020-07-27 NOTE — Addendum Note (Signed)
Addended by: Jimmye Norman on: 07/27/2020 08:51 AM   Modules accepted: Orders

## 2020-07-27 NOTE — Telephone Encounter (Signed)
Spoke to pt's wife Lebron Conners, told her pt wanted me to discuss cholesterol medication with you. Lelon Mast would like to start him on Lipitor 40 mg daily. Lebron Conners said pt did try that after his heart attack and had to stop due to nausea everyday. Told her okay hang on. Told her discussed with Lelon Mast and we are going to start him on Pravachol 20 mg daily. If he has any problems please let us know. Lebron Conners verbalized understanding. Rx sent to pharmacy.

## 2020-08-11 DIAGNOSIS — B0231 Zoster conjunctivitis: Secondary | ICD-10-CM | POA: Diagnosis not present

## 2020-10-13 ENCOUNTER — Other Ambulatory Visit: Payer: Self-pay | Admitting: Physician Assistant

## 2020-11-28 DIAGNOSIS — H179 Unspecified corneal scar and opacity: Secondary | ICD-10-CM | POA: Diagnosis not present

## 2020-11-28 DIAGNOSIS — H04123 Dry eye syndrome of bilateral lacrimal glands: Secondary | ICD-10-CM | POA: Diagnosis not present

## 2020-11-29 DIAGNOSIS — H109 Unspecified conjunctivitis: Secondary | ICD-10-CM | POA: Diagnosis not present

## 2020-12-10 DIAGNOSIS — R059 Cough, unspecified: Secondary | ICD-10-CM | POA: Diagnosis not present

## 2020-12-10 DIAGNOSIS — Z20822 Contact with and (suspected) exposure to covid-19: Secondary | ICD-10-CM | POA: Diagnosis not present

## 2020-12-10 DIAGNOSIS — J029 Acute pharyngitis, unspecified: Secondary | ICD-10-CM | POA: Diagnosis not present

## 2020-12-10 DIAGNOSIS — R509 Fever, unspecified: Secondary | ICD-10-CM | POA: Diagnosis not present

## 2020-12-11 DIAGNOSIS — R6889 Other general symptoms and signs: Secondary | ICD-10-CM | POA: Diagnosis not present

## 2020-12-11 DIAGNOSIS — R059 Cough, unspecified: Secondary | ICD-10-CM | POA: Diagnosis not present

## 2020-12-11 DIAGNOSIS — R509 Fever, unspecified: Secondary | ICD-10-CM | POA: Diagnosis not present

## 2020-12-12 ENCOUNTER — Encounter: Payer: Self-pay | Admitting: Physician Assistant

## 2020-12-12 ENCOUNTER — Telehealth (INDEPENDENT_AMBULATORY_CARE_PROVIDER_SITE_OTHER): Payer: BC Managed Care – PPO | Admitting: Physician Assistant

## 2020-12-12 VITALS — Ht 73.5 in | Wt 209.0 lb

## 2020-12-12 DIAGNOSIS — J029 Acute pharyngitis, unspecified: Secondary | ICD-10-CM

## 2020-12-12 MED ORDER — PREDNISONE 20 MG PO TABS: 40.0000 mg | ORAL_TABLET | Freq: Every day | ORAL | 0 refills | Status: DC

## 2020-12-12 NOTE — Progress Notes (Signed)
TELEPHONE ENCOUNTER   Patient verbally agreed to telephone visit and is aware that copayment and coinsurance may apply. Patient was treated using telemedicine according to accepted telemedicine protocols.  Location of the patient: home Location of provider: [place] Names of all persons participating in the telemedicine service and role in the encounter: Jarold Motto, PA , Trecia Rogers and Germain Osgood (wife)  Subjective:   Chief Complaint  Patient presents with  . Sore Throat     HPI   Sore throat Pt c/o sore throat since Friday. Having difficulty swallowing, has throat pain that is worse on the left side than the right, has hoarse voice. Pt went to Urgent Care Saturday did testing for Flu, Strep both Negative and COVID test pending. Currently on Z-pak, Lidocaine solution. Has been taking tylenol and ibuprofen.  Fever when first started and was 100.4. Appetite is good but throat pain is worsening with time. Symptoms have resolved except throat pain has worsened.   Wife is concerned that this throat is going to close. Denies: stridor, SOB, unusual drooling, neck stiffness, malaise.  Patient Active Problem List   Diagnosis Date Noted  . Myocardial infarction (HCC) 06/17/2020  . Arthritis 06/17/2020  . GERD (gastroesophageal reflux disease) 06/17/2020  . Inflammatory bowel disease 06/17/2020  . Situational insomnia 06/17/2020  . 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 Family history of prostate cancer 06/17/2020   Social History   Tobacco Use  . Smoking status: Former Games developer  . Smokeless tobacco: Never Used  . Tobacco comment: quit 2017  Substance Use Topics  . Alcohol use: Never    Current Outpatient Medications:  .  azithromycin (ZITHROMAX) 250 MG tablet, Take by mouth., Disp: , Rfl:  .  ibuprofen (ADVIL,MOTRIN) 200 MG tablet, Take 800 mg by mouth every 6 (six) hours as needed for moderate pain., Disp: , Rfl:  .  lidocaine (XYLOCAINE) 2 %  solution, 5 mLs every 4 (four) hours as needed., Disp: , Rfl:  .  omeprazole (PRILOSEC) 20 MG capsule, TAKE 1 CAPSULE(20 MG) BY MOUTH DAILY, Disp: 90 capsule, Rfl: 1 .  pravastatin (PRAVACHOL) 20 MG tablet, Take 1 tablet (20 mg total) by mouth daily., Disp: 90 tablet, Rfl: 1 .  prednisoLONE acetate (PRED FORTE) 1 % ophthalmic suspension, SMARTSIG:1 Drop(s) Left Eye Every Other Day, Disp: , Rfl:  .  predniSONE (DELTASONE) 20 MG tablet, Take 2 tablets (40 mg total) by mouth daily., Disp: 10 tablet, Rfl: 0 .  promethazine-dextromethorphan (PROMETHAZINE-DM) 6.25-15 MG/5ML syrup, TAKE 5 ML BY MOUTH AT BEDTIME AS NEEDED FOR COUGH, Disp: , Rfl:  .  tobramycin (TOBREX) 0.3 % ophthalmic solution, Place into the left eye., Disp: , Rfl:  .  traMADol (ULTRAM) 50 MG tablet, tramadol 50 mg tablet, Disp: , Rfl:  .  traZODone (DESYREL) 50 MG tablet, trazodone 50 mg tablet, Disp: , Rfl:  .  valACYclovir (VALTREX) 1000 MG tablet, Take 1 tablet (1,000 mg total) by mouth daily., Disp: 90 tablet, Rfl: 1 Allergies  Allergen Reactions  . Bee Venom Anaphylaxis  . Penicillins Hives and Swelling    Assessment & Plan:   1. Pharyngitis, unspecified etiology    Reviewed urgent care work-up with wife. Unable to visualize patient's throat due to nature of visit. Patient is unable to come by today for evaluation, is planning to come to the office tomorrow for me to evaluate his throat in the parking lot (COVID test pending at this time) around 9:15 am. If appears  concerning, I discussed with wife that we may need to obtain CT scan. Stop ibuprofen and start oral prednisone to see if he can get better symptomatic relief. Worsening precautions discussed with wife, and reviewed threshold to go to urgent care/ER. Wife agreeable to plan.   No orders of the defined types were placed in this encounter.  Meds ordered this encounter  Medications  . predniSONE (DELTASONE) 20 MG tablet    Sig: Take 2 tablets (40 mg total) by  mouth daily.    Dispense:  10 tablet    Refill:  0    Order Specific Question:   Supervising Provider    Answer:   Theodore Demark    Jarold Motto, PA 12/12/2020  Time spent with the patient: 5 minutes, spent in obtaining information about his symptoms, reviewing his previous labs, evaluations, and treatments, counseling him about his condition (please see the discussed topics above), and developing a plan to further investigate it; he had a number of questions which I addressed.

## 2020-12-13 ENCOUNTER — Telehealth: Payer: Self-pay

## 2020-12-13 DIAGNOSIS — U071 COVID-19: Secondary | ICD-10-CM | POA: Diagnosis not present

## 2020-12-13 DIAGNOSIS — Z20822 Contact with and (suspected) exposure to covid-19: Secondary | ICD-10-CM | POA: Diagnosis not present

## 2020-12-13 NOTE — Telephone Encounter (Signed)
Patient called in and stated that his Covid test was positive wanted to know if he was still ok to take the prednisone and is there anything else he need to do. Patient would like a call back at [phone]

## 2020-12-13 NOTE — Telephone Encounter (Signed)
Spoke to pt and his wife, told him Lelon Mast said would recommend him to not take prednisone right now due to positive COVID. Pt's wife said he took a dose last night and this morning. Told them to stop prednisone. Asked pt if he would be interested in antibody infusion? Pt and wife said he is feeling a little better today, sore throat is better, not interested at this time. Told then okay I will send My Chart message with COVID recommendations if any questions or concerns please call. Pt and wife verbalized understanding.

## 2020-12-13 NOTE — Telephone Encounter (Signed)
I would recommend not taking the prednisone right now.  Is he interested in being evaluated for antibody infusion? Lupita Leash -- please send mychart message with my covid recommendations.

## 2020-12-13 NOTE — Telephone Encounter (Signed)
See below

## 2020-12-16 NOTE — Telephone Encounter (Signed)
Unable to send information pt still not signed up for My chart.

## 2021-03-05 ENCOUNTER — Other Ambulatory Visit: Payer: Self-pay

## 2021-03-05 ENCOUNTER — Emergency Department (HOSPITAL_COMMUNITY)
Admission: EM | Admit: 2021-03-05 | Discharge: 2021-03-05 | Disposition: A | Discharge status: Home / Self Care | Payer: Worker's Compensation | Attending: Emergency Medicine | Admitting: Emergency Medicine

## 2021-03-05 ENCOUNTER — Emergency Department (HOSPITAL_COMMUNITY): Payer: Worker's Compensation

## 2021-03-05 ENCOUNTER — Encounter (HOSPITAL_COMMUNITY): Payer: Self-pay

## 2021-03-05 DIAGNOSIS — W19XXXA Unspecified fall, initial encounter: Secondary | ICD-10-CM | POA: Diagnosis not present

## 2021-03-05 DIAGNOSIS — Z87891 Personal history of nicotine dependence: Secondary | ICD-10-CM | POA: Diagnosis not present

## 2021-03-05 DIAGNOSIS — W1789XA Other fall from one level to another, initial encounter: Secondary | ICD-10-CM | POA: Insufficient documentation

## 2021-03-05 DIAGNOSIS — M545 Low back pain, unspecified: Secondary | ICD-10-CM | POA: Diagnosis present

## 2021-03-05 DIAGNOSIS — M542 Cervicalgia: Secondary | ICD-10-CM | POA: Diagnosis not present

## 2021-03-05 DIAGNOSIS — R202 Paresthesia of skin: Secondary | ICD-10-CM | POA: Diagnosis not present

## 2021-03-05 DIAGNOSIS — M549 Dorsalgia, unspecified: Secondary | ICD-10-CM

## 2021-03-05 DIAGNOSIS — Y990 Civilian activity done for income or pay: Secondary | ICD-10-CM | POA: Insufficient documentation

## 2021-03-05 LAB — RAPID URINE DRUG SCREEN, HOSP PERFORMED
Amphetamines: NOT DETECTED
Barbiturates: NOT DETECTED
Benzodiazepines: NOT DETECTED
Cocaine: NOT DETECTED
Opiates: POSITIVE — AB
Tetrahydrocannabinol: NOT DETECTED

## 2021-03-05 LAB — CBC WITH DIFFERENTIAL/PLATELET
Abs Immature Granulocytes: 0.01 10*3/uL (ref 0.00–0.07)
Basophils Absolute: 0.1 10*3/uL (ref 0.0–0.1)
Basophils Relative: 1 %
Eosinophils Absolute: 0 10*3/uL (ref 0.0–0.5)
Eosinophils Relative: 0 %
HCT: 41.4 % (ref 39.0–52.0)
Hemoglobin: 13.4 g/dL (ref 13.0–17.0)
Immature Granulocytes: 0 %
Lymphocytes Relative: 23 %
Lymphs Abs: 1.6 10*3/uL (ref 0.7–4.0)
MCH: 30 pg (ref 26.0–34.0)
MCHC: 32.4 g/dL (ref 30.0–36.0)
MCV: 92.6 fL (ref 80.0–100.0)
Monocytes Absolute: 0.8 10*3/uL (ref 0.1–1.0)
Monocytes Relative: 12 %
Neutro Abs: 4.5 10*3/uL (ref 1.7–7.7)
Neutrophils Relative %: 64 %
Platelets: 196 10*3/uL (ref 150–400)
RBC: 4.47 MIL/uL (ref 4.22–5.81)
RDW: 12.3 % (ref 11.5–15.5)
WBC: 7.1 10*3/uL (ref 4.0–10.5)
nRBC: 0 % (ref 0.0–0.2)

## 2021-03-05 LAB — BASIC METABOLIC PANEL
Anion gap: 8 (ref 5–15)
BUN: 15 mg/dL (ref 6–20)
CO2: 26 mmol/L (ref 22–32)
Calcium: 8.6 mg/dL — ABNORMAL LOW (ref 8.9–10.3)
Chloride: 101 mmol/L (ref 98–111)
Creatinine, Ser: 1.19 mg/dL (ref 0.61–1.24)
GFR, Estimated: >60 mL/min (ref >60)
Glucose, Bld: 88 mg/dL (ref 70–99)
Potassium: 3.8 mmol/L (ref 3.5–5.1)
Sodium: 135 mmol/L (ref 135–145)

## 2021-03-05 MED ORDER — CYCLOBENZAPRINE HCL 10 MG PO TABS: 10.0000 mg | ORAL_TABLET | Freq: Two times a day (BID) | ORAL | 0 refills | Status: DC | PRN

## 2021-03-05 MED ORDER — ONDANSETRON HCL 4 MG/2ML IJ SOLN
4.0000 mg | Freq: Once | INTRAMUSCULAR | Status: AC
  Administered 2021-03-05: 4 mg via INTRAVENOUS
  Filled 2021-03-05: qty 2

## 2021-03-05 MED ORDER — MORPHINE SULFATE (PF) 4 MG/ML IV SOLN
4.0000 mg | Freq: Once | INTRAVENOUS | Status: AC
  Administered 2021-03-05: 4 mg via INTRAVENOUS
  Filled 2021-03-05: qty 1

## 2021-03-05 NOTE — ED Notes (Signed)
Patient transported to CT 

## 2021-03-05 NOTE — ED Triage Notes (Addendum)
Pt arrives via [facility]. Pt states he fell backwards out of his work truck onto boxes of equipment hitting his lower back. Pt states that his legs "feel asleep". Pt also complains of neck pain but denies hitting head. No obvious deformities noted.

## 2021-03-05 NOTE — ED Notes (Signed)
Pt ambulated to restroom. 

## 2021-03-05 NOTE — ED Provider Notes (Signed)
[facility] EMERGENCY DEPARTMENT Provider Note   CSN: [id number] Arrival date & time: 03/05/21  1910     History Chief Complaint  Patient presents with  . Fall    Garrett Caldwell is a 45 y.o. male.  HPI   Patient with significant medical history of shingles, MI, presents with chief complaint of back pain.  Patient states today he was at work and went to step out of the back of his [employer] falling backwards onto a forklift.  He states he landed directly on his back.  He endorses back pain in his lower and neck with numbness sensation down his legs.  He denies hitting his head, losing conscious, is not on anticoagulant.  Patient states he tried to stand up after the incident but felt as if his legs were weak causing him to fall backwards.  He has been unable to ambulate since then.  He denies urinary incontinence, retention, difficult bowel movements, he also endorses that he is having some right lower rib pain, denies pleuritic chest pain or shortness of breath.  Patient denies  alleviating factors.  Patient denies headaches, fevers, chills, shortness of breath, abdominal pain, nausea, vomit, diarrhea, worsening pedal edema.  Past Medical History:  Diagnosis Date  . Allergy   . Arthritis   . GERD (gastroesophageal reflux disease)   . History of chicken pox   . History of shingles   . MI (myocardial infarction) [facility]) 2013    Patient Active Problem List   Diagnosis Date Noted  . Myocardial infarction (HCC) 06/17/2020  . Arthritis 06/17/2020  . GERD (gastroesophageal reflux disease) 06/17/2020  . Inflammatory bowel disease 06/17/2020  . Situational insomnia 06/17/2020  . 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 Family history of prostate cancer 06/17/2020    History reviewed. No pertinent surgical history.     Family History  Problem Relation Age of Onset  . Prostate cancer Father        mets to body    Social History   Tobacco Use  . Smoking status: Former  Games developer  . Smokeless tobacco: Never Used  . Tobacco comment: quit 2017  Vaping Use  . Vaping Use: Never used  Substance Use Topics  . Alcohol use: Never  . Drug use: Never    Home Medications Prior to Admission medications   Medication Sig Start Date End Date Taking? Authorizing Provider  cyclobenzaprine (FLEXERIL) 10 MG tablet Take 1 tablet (10 mg total) by mouth 2 (two) times daily as needed for muscle spasms. 03/05/21  Yes Carroll Sage, PA-C  azithromycin (ZITHROMAX) 250 MG tablet Take by mouth. 12/10/20   [provider]  ibuprofen (ADVIL,MOTRIN) 200 MG tablet Take 800 mg by mouth every 6 (six) hours as needed for moderate pain.    [provider]  lidocaine (XYLOCAINE) 2 % solution 5 mLs every 4 (four) hours as needed. 12/10/20   [provider]  omeprazole (PRILOSEC) 20 MG capsule TAKE 1 CAPSULE(20 MG) BY MOUTH DAILY 10/13/20   Jarold Motto, PA  pravastatin (PRAVACHOL) 20 MG tablet Take 1 tablet (20 mg total) by mouth daily. 07/27/20   Jarold Motto, PA  prednisoLONE acetate (PRED FORTE) 1 % ophthalmic suspension SMARTSIG:1 Drop(s) Left Eye Every Other Day 11/29/20   [provider]  predniSONE (DELTASONE) 20 MG tablet Take 2 tablets (40 mg total) by mouth daily. 12/12/20   Jarold Motto, PA  promethazine-dextromethorphan (PROMETHAZINE-DM) 6.25-15 MG/5ML syrup TAKE 5 ML BY MOUTH AT BEDTIME  AS NEEDED FOR COUGH 12/10/20   [provider]  tobramycin (TOBREX) 0.3 % ophthalmic solution Place into the left eye. 11/29/20   [provider]  traMADol (ULTRAM) 50 MG tablet tramadol 50 mg tablet    [provider]  traZODone (DESYREL) 50 MG tablet trazodone 50 mg tablet    [provider]  valACYclovir (VALTREX) 1000 MG tablet Take 1 tablet (1,000 mg total) by mouth daily. 06/17/20   Jarold Motto, PA    Allergies    Bee venom and Penicillins  Review of Systems   Review of Systems  Constitutional: Negative for  chills and fever.  HENT: Negative for congestion.   Respiratory: Negative for shortness of breath.   Cardiovascular: Negative for chest pain.  Gastrointestinal: Negative for abdominal pain, diarrhea and vomiting.  Genitourinary: Negative for enuresis.  Musculoskeletal: Positive for back pain and neck pain.  Skin: Negative for rash.  Neurological: Negative for dizziness and headaches.  Hematological: Does not bruise/bleed easily.    Physical Exam Updated Vital Signs BP 135/79   Pulse 65   Temp 98 F (36.7 C) (Oral)   Resp 19   Ht 6\' 2"  (1.88 m)   Wt 97.5 kg   SpO2 99%   BMI 27.60 kg/m   Physical Exam Vitals and nursing note reviewed.  Constitutional:      General: He is not in acute distress.    Appearance: He is not ill-appearing.  HENT:     Head: Normocephalic and atraumatic.     Nose: No congestion.  Eyes:     Conjunctiva/sclera: Conjunctivae normal.  Cardiovascular:     Rate and Rhythm: Normal rate and regular rhythm.     Pulses: Normal pulses.     Heart sounds: No murmur heard. No friction rub. No gallop.      Comments: Chest was palpated he has noted tenderness along the anterior lateral aspect of the right eighth and ninth rib, no deformities present. Pulmonary:     Effort: No respiratory distress.     Breath sounds: No wheezing, rhonchi or rales.  Abdominal:     Palpations: Abdomen is soft.     Tenderness: There is no abdominal tenderness.  Musculoskeletal:     Right lower leg: No edema.     Left lower leg: No edema.     Comments: Patient spine was palpated he has noted tenderness along his C-spine at 7 and 8, no deformities present.  He also has tenderness on his thoracic and lumbar spine, there is no crepitus or step-off present.  Patient has full range of motion in his toes ankles and knees, he has decreased strength due to pain.  Neurovascular fully intact.  There is no noted leg shortening or internal or external rotation.  Skin:    General: Skin is  warm and dry.  Neurological:     Mental Status: He is alert.  Psychiatric:        Mood and Affect: Mood normal.     ED Results / Procedures / Treatments   Labs (all labs ordered are listed, but only abnormal results are displayed) Labs Reviewed  BASIC METABOLIC PANEL - Abnormal; Notable for the following components:      Result Value   Calcium 8.6 (*)    All other components within normal limits  CBC WITH DIFFERENTIAL/PLATELET  RAPID URINE DRUG SCREEN, HOSP PERFORMED    EKG None  Radiology DG Ribs Unilateral W/Chest Right  Result Date: 03/05/2021 CLINICAL DATA:  Fall  from truck with back pain on the right, initial encounter EXAM: RIGHT RIBS AND CHEST - 3+ VIEW COMPARISON:  None. FINDINGS: Cardiac shadow is within normal limits. The lungs are well aerated bilaterally. No focal infiltrate or sizable effusion is noted. No pneumothorax is seen. No rib abnormality is noted. IMPRESSION: No acute abnormality noted. Electronically Signed   By: Alcide Clever M.D.   On: 03/05/2021 20:57   CT Cervical Spine Wo Contrast  Result Date: 03/05/2021 CLINICAL DATA:  Initial evaluation for acute trauma, fall. EXAM: CT CERVICAL SPINE WITHOUT CONTRAST TECHNIQUE: Multidetector CT imaging of the cervical spine was performed without intravenous contrast. Multiplanar CT image reconstructions were also generated. COMPARISON:  None available. FINDINGS: Alignment: Straightening with slight reversal of the normal cervical lordosis. No listhesis. Skull base and vertebrae: Skull base intact. Normal C1-2 articulations are preserved in the dens is intact. Vertebral body heights maintained. No acute fracture. Soft tissues and spinal canal: Soft tissues of the neck demonstrate no acute finding. No abnormal prevertebral edema. Spinal canal within normal limits. Disc levels: C2-3: Unremarkable. C3-4: Small central disc protrusion mildly indents the ventral thecal sac. Mild spinal stenosis without significant cord deformity.  Left-sided uncovertebral spurring with resultant mild left C4 foraminal narrowing. Right neural foramen remains patent. C4-5: Shallow left paracentral disc osteophyte complex flattens the ventral thecal sac, greater on the left (series 5, image 59). Mild spinal stenosis with mild cord flattening. Moderate left with mild right C5 foraminal stenosis. C5-6: Small central disc protrusion mildly indents the ventral thecal sac without significant spinal stenosis. Foramina remain patent. C6-7: Unremarkable. C7-T1: Left worse than right facet hypertrophy. Negative interspace. No significant stenosis. Upper chest: Visualized upper chest demonstrates no acute finding. Partially visualized lung apices are clear. Other: None. IMPRESSION: 1. No acute traumatic injury within the cervical spine. 2. Degenerative spondylosis at C3-4 and C4-5 with resultant mild spinal stenosis, with mild cord flattening and moderate left foraminal stenosis at the level of C4-5. Electronically Signed   By: Rise Mu M.D.   On: 03/05/2021 21:12   CT Thoracic Spine Wo Contrast  Result Date: 03/05/2021 CLINICAL DATA:  Initial evaluation for acute trauma, fall. Lower extremity paresthesias. EXAM: CT THORACIC SPINE WITHOUT CONTRAST TECHNIQUE: Multidetector CT images of the thoracic were obtained using the standard protocol without intravenous contrast. COMPARISON:  None available. FINDINGS: Alignment: Trace scoliotic curvature. Alignment otherwise normal preservation of the normal thoracic kyphosis. No listhesis. Vertebrae: Vertebral body height maintained without acute or chronic fracture. Visualized ribs intact. Few scattered subcentimeter benign bone islands noted. No worrisome osseous lesions. Paraspinal and other soft tissues: Paraspinous soft tissues demonstrate no acute finding. Partially visualized lungs are clear. Visualized visceral structures unremarkable. Disc levels: No significant disc pathology seen within the thoracic spine.  Mild scattered multilevel facet hypertrophy. No stenosis or impingement. IMPRESSION: 1. No acute traumatic injury within the thoracic spine. 2. Mild multilevel facet hypertrophy. No significant disc pathology. No stenosis or neural impingement. Electronically Signed   By: Rise Mu M.D.   On: 03/05/2021 21:31   CT Lumbar Spine Wo Contrast  Result Date: 03/05/2021 CLINICAL DATA:  Initial evaluation for acute trauma, fall. Lower extremity paresthesias. EXAM: CT LUMBAR SPINE WITHOUT CONTRAST TECHNIQUE: Multidetector CT imaging of the lumbar spine was performed without intravenous contrast administration. Multiplanar CT image reconstructions were also generated. COMPARISON:  Prior CT from 04/10/2015 FINDINGS: Segmentation: Standard. Lowest well-formed disc space labeled the L5-S1 level. Alignment: Physiologic with preservation of the normal lumbar lordosis. No significant listhesis.  Vertebrae: Vertebral body height maintained without acute or chronic fracture. Visualized sacrum and pelvis intact. No worrisome osseous lesions. Paraspinal and other soft tissues: Paraspinous soft tissues demonstrate no acute finding. Mild aorto bi-iliac atherosclerotic disease. Colonic diverticulosis partially visualized. Disc levels: L1-2: Negative interspace. Minimal facet hypertrophy. No stenosis. L2-3: Negative interspace. Mild facet hypertrophy. No stenosis or impingement. L3-4: Mild disc bulge. Mild bilateral facet hypertrophy. No significant spinal stenosis. Foramina remain patent. L4-5: Mild disc bulge. Mild facet and ligament flavum hypertrophy. Resultant mild canal with bilateral lateral recess stenosis. Mild bilateral L4 foraminal narrowing. L5-S1: Mild disc bulge with annular calcification. Mild facet hypertrophy. No significant spinal stenosis. Moderate bilateral L5 foraminal narrowing. IMPRESSION: 1. No acute traumatic injury within the lumbar spine. 2. Mild disc bulging with facet hypertrophy at L4-5 with  resultant mild canal with bilateral lateral recess stenosis. 3. Moderate bilateral L5 foraminal narrowing related to disc bulge and facet hypertrophy. 4. Colonic diverticulosis. 5. Aortic Atherosclerosis (ICD10-I70.0). Electronically Signed   By: Rise MuBenjamin  McClintock M.D.   On: 03/05/2021 21:38    Procedures Procedures   Medications Ordered in ED Medications  morphine 4 MG/ML injection 4 mg (4 mg Intravenous Given 03/05/21 2001)  ondansetron (ZOFRAN) injection 4 mg (4 mg Intravenous Given 03/05/21 2013)    ED Course  I have reviewed the triage vital signs and the nursing notes.  Pertinent labs & imaging results that were available during my care of the patient were reviewed by me and considered in my medical decision making (see chart for details).    MDM Rules/Calculators/A&P                         Initial impression-patient presents after a mechanical fall with lower back pain.  Patient does not appear to be in acute distress, vital signs reassuring.  I am concerned for orthopedic injury, will obtain imaging of his back and chest and reassess.  Work-up-chest x-ray was negative for acute findings.  CT C-spine negative for acute findings, does show some spinal stenosis at C3 and C4.  CT lumbar spine negative for acute findings.  CT lumbar spine negative for acute findings, does show mild disc bulging at L4 and L5, with resulting foraminal narrowing.  Reassessment patient was reassessed after pain medications, he states he is feels much better, has no complaints at this time.  Vital signs are remained stable.  Patient is agreeable for discharge at this time.  Rule out-I have low suspicion for intracranial head bleed or cranial fracture as skull was palpated was nontender to palpation, there is no neurodeficits present on my exam.  Low suspicion for spinal cord abnormality or spinal fracture as there is no gross deformities present my exam, imaging was negative for acute findings, patient is  moving all 4 extremities without difficulty.  Low suspicion for rib fracture or pneumothorax as lung sounds are clear bilaterally, x-rays negative for acute findings.  Plan-  1.  Back pain since resolved-suspect this is more muscular but patient does have some disc protrusion in the lower lumbar spine.  Will provide patient with muscle relaxers, have him follow-up with neurosurgery for further evaluation.  Vital signs have remained stable, no indication for hospital admission. Patient given at home care as well strict return precautions.  Patient verbalized that they understood agreed to said plan.   Final Clinical Impression(s) / ED Diagnoses Final diagnoses:  Fall, initial encounter  Other acute back pain    Rx /  DC Orders ED Discharge Orders         Ordered    cyclobenzaprine (FLEXERIL) 10 MG tablet  2 times daily PRN        03/05/21 2159           Barnie Del 03/05/21 2202    Eber Hong, MD 03/10/21 [phone]

## 2021-03-05 NOTE — Discharge Instructions (Signed)
You have been seen here for back pain.  I have given you a prescription for a muscle relaxer please take as prescribed.  Please bewear this medication make you drowsy do not consume alcohol or operate heavy machinery when taking this medication.  I recommend taking over-the-counter pain medications like ibuprofen and/or Tylenol every 6 as needed.  Please follow dosage and on the back of bottle.  I also recommend applying heat to the area and stretching out the muscles as this will help decrease stiffness and pain.  I have given you information on exercises please follow.  You have some disc protrusion in your lumbar spine I recommend follow-up with neurosurgery for further evaluation.  Come back to the emergency department if you develop chest pain, shortness of breath, severe abdominal pain, uncontrolled nausea, vomiting, diarrhea.

## 2021-04-11 ENCOUNTER — Other Ambulatory Visit: Payer: Self-pay | Admitting: Physician Assistant

## 2021-06-29 ENCOUNTER — Ambulatory Visit (INDEPENDENT_AMBULATORY_CARE_PROVIDER_SITE_OTHER): Payer: BC Managed Care – PPO | Admitting: Family

## 2021-06-29 ENCOUNTER — Other Ambulatory Visit: Payer: Self-pay

## 2021-06-29 ENCOUNTER — Encounter: Payer: Self-pay | Admitting: Family

## 2021-06-29 VITALS — BP 140/80 | HR 85 | Temp 98.5°F | Ht 74.0 in | Wt 208.4 lb

## 2021-06-29 DIAGNOSIS — K219 Gastro-esophageal reflux disease without esophagitis: Secondary | ICD-10-CM

## 2021-06-29 DIAGNOSIS — R1033 Periumbilical pain: Secondary | ICD-10-CM

## 2021-06-29 LAB — COMPREHENSIVE METABOLIC PANEL
ALT: 19 U/L (ref 0–53)
AST: 19 U/L (ref 0–37)
Albumin: 4.2 g/dL (ref 3.5–5.2)
Alkaline Phosphatase: 89 U/L (ref 39–117)
BUN: 13 mg/dL (ref 6–23)
CO2: 25 mEq/L (ref 19–32)
Calcium: 9.3 mg/dL (ref 8.4–10.5)
Chloride: 106 mEq/L (ref 96–112)
Creatinine, Ser: 1.07 mg/dL (ref 0.40–1.50)
GFR: 83.08 mL/min (ref >60.00)
Glucose, Bld: 107 mg/dL — ABNORMAL HIGH (ref 70–99)
Potassium: 3.3 mEq/L — ABNORMAL LOW (ref 3.5–5.1)
Sodium: 139 mEq/L (ref 135–145)
Total Bilirubin: 0.4 mg/dL (ref 0.2–1.2)
Total Protein: 6.6 g/dL (ref 6.0–8.3)

## 2021-06-29 LAB — CBC WITH DIFFERENTIAL/PLATELET
Basophils Absolute: 0.1 10*3/uL (ref 0.0–0.1)
Basophils Relative: 0.8 % (ref 0.0–3.0)
Eosinophils Absolute: 0 10*3/uL (ref 0.0–0.7)
Eosinophils Relative: 0.6 % (ref 0.0–5.0)
HCT: 42.1 % (ref 39.0–52.0)
Hemoglobin: 13.8 g/dL (ref 13.0–17.0)
Lymphocytes Relative: 24.9 % (ref 12.0–46.0)
Lymphs Abs: 1.8 10*3/uL (ref 0.7–4.0)
MCHC: 32.9 g/dL (ref 30.0–36.0)
MCV: 89.9 fl (ref 78.0–100.0)
Monocytes Absolute: 0.8 10*3/uL (ref 0.1–1.0)
Monocytes Relative: 10.3 % (ref 3.0–12.0)
Neutro Abs: 4.6 10*3/uL (ref 1.4–7.7)
Neutrophils Relative %: 63.4 % (ref 43.0–77.0)
Platelets: 193 10*3/uL (ref 150.0–400.0)
RBC: 4.68 Mil/uL (ref 4.22–5.81)
RDW: 13.1 % (ref 11.5–15.5)
WBC: 7.3 10*3/uL (ref 4.0–10.5)

## 2021-06-29 MED ORDER — ESOMEPRAZOLE MAGNESIUM 40 MG PO CPDR: 40.0000 mg | DELAYED_RELEASE_CAPSULE | Freq: Every day | ORAL | 3 refills | Status: DC

## 2021-06-29 NOTE — Patient Instructions (Signed)

## 2021-06-29 NOTE — Progress Notes (Signed)
Acute Office Visit  Subjective:    Patient ID: Garrett Caldwell, male    DOB: Feb 07, 1976, 45 y.o.   MRN: [id number]  Chief Complaint  Patient presents with  . Abdominal Pain    He describes it as being a dull pain in his belly. He says that Omeprazole does not help. Starting 3 weeks ago.    HPI Patient is in today with c/o increased heartburn, dull belly pain 3-4/10 x 3 weeks off and on. Takes Omeprazole that he has increased to 40 mg daily from 20 mg. Also takes Tums that helps. Denies any constipation, diarrhea, nausea, or vomiting.   Past Medical History:  Diagnosis Date  . Allergy   . Arthritis   . GERD (gastroesophageal reflux disease)   . History of chicken pox   . History of shingles   . MI (myocardial infarction) ([place]) 2013    History reviewed. No pertinent surgical history.  Family History  Problem Relation Age of Onset  . Prostate cancer Father        mets to body    Social History   Socioeconomic History  . Marital status: Married    Spouse name: Not on file  . Number of children: Not on file  . Years of education: Not on file  . Highest education level: Not on file  Occupational History  . Not on file  Tobacco Use  . Smoking status: Former  . Smokeless tobacco: Never  . Tobacco comments:    quit 2017  Vaping Use  . Vaping Use: Never used  Substance and Sexual Activity  . Alcohol use: Never  . Drug use: Never  . Sexual activity: Yes  Other Topics Concern  . 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 ibuprofen (ADVIL,MOTRIN) 200 MG tablet Take 800 mg by mouth every 6 (six) hours as needed for moderate pain.    . pravastatin (PRAVACHOL) 20 MG tablet Take 1 tablet (20 mg total) by mouth daily. 90 tablet 1  . prednisoLONE acetate (PRED FORTE) 1 % ophthalmic suspension SMARTSIG:1 Drop(s) Left Eye Every Other Day    . predniSONE (DELTASONE) 20 MG tablet Take 2 tablets (40 mg total) by mouth daily. 10 tablet 0  . tobramycin (TOBREX) 0.3 % ophthalmic solution Place into the left eye.    . traMADol (ULTRAM) 50 MG tablet tramadol 50 mg tablet    . traZODone (DESYREL) 50 MG tablet trazodone 50 mg tablet    . valACYclovir (VALTREX) 1000 MG tablet Take 1 tablet (1,000 mg total) by mouth daily. 90 tablet 1  . omeprazole (PRILOSEC) 20 MG capsule TAKE 1 CAPSULE(20 MG) BY MOUTH DAILY 90 capsule 0  . azithromycin (ZITHROMAX) 250 MG tablet Take by mouth. (Patient not taking: Reported on 06/29/2021)    . cyclobenzaprine (FLEXERIL) 10 MG tablet Take 1 tablet (10 mg total) by mouth 2 (two) times daily as needed for muscle spasms. (Patient not taking: Reported on 06/29/2021) 20 tablet  0  . lidocaine (XYLOCAINE) 2 % solution 5 mLs every 4 (four) hours as needed. (Patient not taking: Reported on 06/29/2021)    . promethazine-dextromethorphan (PROMETHAZINE-DM) 6.25-15 MG/5ML syrup TAKE 5 ML BY MOUTH AT BEDTIME AS NEEDED FOR COUGH (Patient not taking: Reported on 06/29/2021)     No facility-administered medications prior to visit.    Allergies  Allergen Reactions  . Bee Venom Anaphylaxis  . Penicillins Hives and Swelling    Review of Systems  Constitutional: Negative.   Respiratory: Negative.    Cardiovascular: Negative.   Gastrointestinal:  Positive for abdominal pain. Negative for blood in stool, constipation, diarrhea and nausea.  Musculoskeletal: Negative.   Skin: Negative.   Allergic/Immunologic: Negative.   Neurological: Negative.   Psychiatric/Behavioral: Negative.        Objective:     Physical Exam Vitals and nursing note reviewed.  Constitutional:      Appearance: He is well-developed.  Cardiovascular:     Rate and Rhythm: Regular rhythm.  Abdominal:     General: Abdomen is flat and scaphoid. Bowel sounds are normal. There is no distension.     Palpations: Abdomen is soft.     Tenderness: There is no abdominal tenderness. There is no guarding or rebound.  Skin:    General: Skin is warm and dry.  Neurological:     General: No focal deficit present.     Mental Status: He is alert.   BP 140/80   Pulse 85   Temp 98.5 F (36.9 C) (Temporal)   Ht _0  (1.88 m)   Wt 208 lb 6.4 oz (94.5 kg)   SpO2 97%   BMI 26.76 kg/m  Wt Readings from Last 3 Encounters:  06/29/21 208 lb 6.4 oz (94.5 kg)  03/05/21 215 lb (97.5 kg)  12/12/20 209 lb (94.8 kg)    Health Maintenance Due  Topic Date Due  . COLONOSCOPY (Pts 45-15yr Insurance coverage will need to be confirmed)  Never done    There are no preventive care reminders to display for this patient.   No results found for: TSH Lab Results  Component Value Date   WBC 7.1 03/05/2021   HGB 13.4 03/05/2021   HCT 41.4 03/05/2021   MCV 92.6 03/05/2021   PLT 196 03/05/2021   Lab Results  Component Value Date   NA 135 03/05/2021   K 3.8 03/05/2021   CO2 26 03/05/2021   GLUCOSE 88 03/05/2021   BUN 15 03/05/2021   CREATININE 1.19 03/05/2021   BILITOT 0.4 06/17/2020   AST 16 06/17/2020   ALT 15 06/17/2020   PROT 6.5 06/17/2020   CALCIUM 8.6 (L) 03/05/2021   ANIONGAP 8 03/05/2021   Lab Results  Component Value Date   CHOL 222 (H) 06/17/2020   Lab Results  Component Value Date   HDL 44 06/17/2020   Lab Results  Component Value Date   LDLCALC 152 (H) 06/17/2020   Lab Results  Component Value Date   TRIG 140 06/17/2020   Lab Results  Component Value Date   CHOLHDL 5.0 (H) 06/17/2020   No results found for: HGBA1C     Assessment & Plan:   Problem List Items Addressed This Visit     GERD  (gastroesophageal reflux disease)   Relevant Medications   esomeprazole (NEXIUM) 40 MG capsule   Other Relevant Orders   DG Abd 1 View   H. pylori breath test   CBC w/Diff   Comp Met (CMET)   Other Visit  Diagnoses     Periumbilical abdominal pain    -  Primary   Relevant Orders   DG Abd 1 View   H. pylori breath test   CBC w/Diff   Comp Met (CMET)        Meds ordered this encounter  Medications  . esomeprazole (NEXIUM) 40 MG capsule    Sig: Take 1 capsule (40 mg total) by mouth daily.    Dispense:  30 capsule    Refill:  3   Labs obtained. Call the office if symptoms worsen or persist. D/C Omeprazole. Start Nexium 40 mg.   Kennyth Arnold, FNP

## 2021-06-30 ENCOUNTER — Ambulatory Visit (INDEPENDENT_AMBULATORY_CARE_PROVIDER_SITE_OTHER)
Admission: RE | Admit: 2021-06-30 | Discharge: 2021-06-30 | Disposition: A | Discharge status: Home / Self Care | Payer: BC Managed Care – PPO | Source: Ambulatory Visit | Attending: Family | Admitting: Family

## 2021-06-30 DIAGNOSIS — R1033 Periumbilical pain: Secondary | ICD-10-CM | POA: Diagnosis not present

## 2021-06-30 DIAGNOSIS — K219 Gastro-esophageal reflux disease without esophagitis: Secondary | ICD-10-CM | POA: Diagnosis not present

## 2021-06-30 DIAGNOSIS — R109 Unspecified abdominal pain: Secondary | ICD-10-CM | POA: Diagnosis not present

## 2021-06-30 LAB — H. PYLORI BREATH TEST: H. pylori Breath Test: NOT DETECTED

## 2021-07-01 ENCOUNTER — Other Ambulatory Visit: Payer: Self-pay | Admitting: Family

## 2021-07-01 MED ORDER — POTASSIUM CHLORIDE CRYS ER 20 MEQ PO TBCR
20.0000 meq | EXTENDED_RELEASE_TABLET | Freq: Every day | ORAL | 0 refills | Status: DC
Start: 2021-07-01 — End: 2021-07-27

## 2021-07-12 NOTE — Progress Notes (Signed)
Garrett Caldwell is a 45 y.o. male here for a follow up of abdominal pain .    History of Present Illness:   Chief Complaint  Patient presents with   Abdominal Pain    HPI  Abdominal pain   Garrett Caldwell presents with c/o periumbilical abdominal pain that has been onset for a month now. He previously saw Garrett Caldwell for this issue on 06/29/21 and was prescribed Nexium 40 mg, which he recently stopped taking. The pain is described as a dull sensation underneath his belly button. According to his wife he ingests a high amount of ibuprofen or advil due to body aches and it helps him sleep. Takes at least daily.  Garrett Caldwell says that he take TUMS regularly to provide relief of his abdominal pain but doesn't know if it is effective. There aren't any modifying factors that he can identify. Denies nausea, vomiting, diarrhea, constipation, rectal bleeding, urinary issues, rectal pain, hematochezia, or melena.   Past Medical History:  Diagnosis Date   Allergy    Arthritis    GERD (gastroesophageal reflux disease)    History of chicken pox    History of shingles    MI (myocardial infarction) (HCC) 2013     Social History   Tobacco Use   Smoking status: Former   Smokeless tobacco: Never   Tobacco comments:    quit 2017  Vaping Use   Vaping Use: Never used  Substance Use Topics   Alcohol use: Never   Drug use: Never    History reviewed. No pertinent surgical history.  Family History  Problem Relation Age of Onset   Prostate cancer Father        mets to body    Allergies  Allergen Reactions   Bee Venom Anaphylaxis   Penicillins Hives and Swelling    Current Medications:   Current Outpatient Medications:    esomeprazole (NEXIUM) 40 MG capsule, Take 1 capsule (40 mg total) by mouth daily., Disp: 30 capsule, Rfl: 3   ibuprofen (ADVIL,MOTRIN) 200 MG tablet, Take 800 mg by mouth every 6 (six) hours as needed for moderate pain., Disp: , Rfl:    prednisoLONE acetate (PRED FORTE)  1 % ophthalmic suspension, SMARTSIG:1 Drop(s) Left Eye Every Other Day, Disp: , Rfl:    traZODone (DESYREL) 50 MG tablet, trazodone 50 mg tablet, Disp: , Rfl:    valACYclovir (VALTREX) 1000 MG tablet, Take 1 tablet (1,000 mg total) by mouth daily., Disp: 90 tablet, Rfl: 1   potassium chloride SA (KLOR-CON) 20 MEQ tablet, Take 1 tablet (20 mEq total) by mouth daily. (Patient not taking: Reported on 07/13/2021), Disp: 30 tablet, Rfl: 0   Review of Systems:   ROS Negative unless otherwise specified per HPI.  Vitals:   Vitals:   07/13/21 0939  BP: 102/70  Pulse: 68  Temp: 98.8 F (37.1 C)  TempSrc: Temporal  SpO2: 98%  Weight: 212 lb 8 oz (96.4 kg)  Height: 6\' 2"  (1.88 m)     Body mass index is 27.28 kg/m.  Physical Exam:   Physical Exam Vitals and nursing note reviewed.  Constitutional:      General: He is not in acute distress.    Appearance: He is well-developed. He is not ill-appearing or toxic-appearing.  Cardiovascular:     Rate and Rhythm: Normal rate and regular rhythm.     Pulses: Normal pulses.     Heart sounds: Normal heart sounds, S1 normal and S2 normal.  Pulmonary:  Effort: Pulmonary effort is normal.     Breath sounds: Normal breath sounds.  Abdominal:     General: Bowel sounds are normal.     Palpations: Abdomen is soft.     Tenderness: There is abdominal tenderness in the right lower quadrant. There is no guarding.  Skin:    General: Skin is warm and dry.  Neurological:     Mental Status: He is alert.     GCS: GCS eye subscore is 4. GCS verbal subscore is 5. GCS motor subscore is 6.  Psychiatric:        Speech: Speech normal.        Behavior: Behavior normal. Behavior is cooperative.    Assessment and Plan:   Lower abdominal pain Repeat BMP and add UA today Order stat CT abd/pel for further evaluation Discussed limiting NSAIDs. Trial robaxin for muscle pain Worsening precautions advised in the interim Further intervention based on  results  Special screening for malignant neoplasms, colon Referral to GI.  I,Havlyn C Ratchford,acting as a Neurosurgeon for [employer], PA.,have documented all relevant documentation on the behalf of Jarold Motto, PA,as directed by  Jarold Motto, PA while in the presence of Jarold Motto, [place].  I, Jarold Motto, [place], have reviewed all documentation for this visit. The documentation on 07/13/21 for the exam, diagnosis, procedures, and orders are all accurate and complete.  Jarold Motto, PA-C

## 2021-07-13 ENCOUNTER — Ambulatory Visit (INDEPENDENT_AMBULATORY_CARE_PROVIDER_SITE_OTHER): Payer: BC Managed Care – PPO | Admitting: Physician Assistant

## 2021-07-13 ENCOUNTER — Encounter: Payer: Self-pay | Admitting: Physician Assistant

## 2021-07-13 ENCOUNTER — Other Ambulatory Visit: Payer: Self-pay

## 2021-07-13 ENCOUNTER — Encounter: Payer: Self-pay | Admitting: Gastroenterology

## 2021-07-13 ENCOUNTER — Ambulatory Visit (HOSPITAL_COMMUNITY): Payer: BC Managed Care – PPO

## 2021-07-13 VITALS — BP 102/70 | HR 68 | Temp 98.8°F | Ht 74.0 in | Wt 212.5 lb

## 2021-07-13 DIAGNOSIS — Z1211 Encounter for screening for malignant neoplasm of colon: Secondary | ICD-10-CM | POA: Diagnosis not present

## 2021-07-13 DIAGNOSIS — R1030 Lower abdominal pain, unspecified: Secondary | ICD-10-CM | POA: Diagnosis not present

## 2021-07-13 LAB — URINALYSIS, ROUTINE W REFLEX MICROSCOPIC
Bilirubin Urine: NEGATIVE
Hgb urine dipstick: NEGATIVE
Ketones, ur: NEGATIVE
Leukocytes,Ua: NEGATIVE
Nitrite: NEGATIVE
RBC / HPF: NONE SEEN (ref 0–?)
Specific Gravity, Urine: 1.015 (ref 1.000–1.030)
Total Protein, Urine: NEGATIVE
Urine Glucose: NEGATIVE
Urobilinogen, UA: 0.2 (ref 0.0–1.0)
WBC, UA: NONE SEEN (ref 0–?)
pH: 7 (ref 5.0–8.0)

## 2021-07-13 LAB — LIPASE: Lipase: 27 U/L (ref 11.0–59.0)

## 2021-07-13 LAB — BASIC METABOLIC PANEL
BUN: 12 mg/dL (ref 6–23)
CO2: 32 mEq/L (ref 19–32)
Calcium: 9.5 mg/dL (ref 8.4–10.5)
Chloride: 103 mEq/L (ref 96–112)
Creatinine, Ser: 1.12 mg/dL (ref 0.40–1.50)
GFR: 78.62 mL/min (ref >60.00)
Glucose, Bld: 78 mg/dL (ref 70–99)
Potassium: 4.4 mEq/L (ref 3.5–5.1)
Sodium: 140 mEq/L (ref 135–145)

## 2021-07-13 MED ORDER — METHOCARBAMOL 500 MG PO TABS: 500.0000 mg | ORAL_TABLET | Freq: Every evening | ORAL | 0 refills | Status: DC | PRN

## 2021-07-13 NOTE — Addendum Note (Signed)
Addended by: Haynes Bast on: 07/13/2021 10:27 AM   Modules accepted: Orders

## 2021-07-13 NOTE — Patient Instructions (Signed)
It was great to see you!  Trial robaxin for sleep and muscle aches/pain instead of your over the counter ibuprofen  Referral to GI for your colonoscopy and acute abdominal pain  Repeat bloodwork to check on your potassium and update urine study  Order abdominal and pelvic CT for further evaluation  If a referral was placed today, you will be contacted for an appointment. Please note that routine referrals can sometimes take up to 3-4 weeks to process. Please call our office if you haven't heard anything after this time frame.  Take care,  Jarold Motto PA-C

## 2021-07-14 ENCOUNTER — Telehealth: Payer: Self-pay

## 2021-07-14 MED ORDER — VALACYCLOVIR HCL 1 G PO TABS: 1000.0000 mg | ORAL_TABLET | Freq: Every day | ORAL | 1 refills | Status: DC

## 2021-07-14 NOTE — Progress Notes (Signed)
Please call patient  All labs and urine test are normal

## 2021-07-14 NOTE — Telephone Encounter (Signed)
Spoke to [place] told her Rx was sent to pharmacy. Lebron Conners verbalized understanding and said pt sees GI next Friday and CT scan is for Thurs 10/13. Told her okay I will let Samantha know.   Samantha notified.

## 2021-07-14 NOTE — Telephone Encounter (Signed)
  Encourage patient to contact the pharmacy for refills or they can request refills through [facility]  LAST APPOINTMENT DATE:  07/13/21  NEXT APPOINTMENT DATE:  MEDICATION:valACYclovir (VALTREX) 1000 MG tablet  Is the patient out of medication? Yes   PHARMACY: [id number] - [place], [place] - [address] [place] [address]  COMMENTS: Was prescribed for the shingles and now he has a flare up with blisters on his face.   Let patient know to contact pharmacy at the end of the day to make sure medication is ready.  Please notify patient to allow 48-72 hours to process

## 2021-07-20 ENCOUNTER — Other Ambulatory Visit: Payer: Self-pay

## 2021-07-20 ENCOUNTER — Ambulatory Visit (HOSPITAL_COMMUNITY)
Admission: RE | Admit: 2021-07-20 | Discharge: 2021-07-20 | Disposition: A | Discharge status: Home / Self Care | Payer: BC Managed Care – PPO | Source: Ambulatory Visit | Attending: Physician Assistant | Admitting: Physician Assistant

## 2021-07-20 DIAGNOSIS — I700 Atherosclerosis of aorta: Secondary | ICD-10-CM | POA: Diagnosis not present

## 2021-07-20 DIAGNOSIS — R1030 Lower abdominal pain, unspecified: Secondary | ICD-10-CM | POA: Diagnosis not present

## 2021-07-20 MED ORDER — IOHEXOL 350 MG/ML SOLN
80.0000 mL | Freq: Once | INTRAVENOUS | Status: AC | PRN
  Administered 2021-07-20: 80 mL via INTRAVENOUS

## 2021-07-21 ENCOUNTER — Encounter: Payer: BC Managed Care – PPO | Admitting: Physician Assistant

## 2021-07-21 ENCOUNTER — Encounter: Payer: Self-pay | Admitting: Physician Assistant

## 2021-07-21 ENCOUNTER — Encounter: Payer: Self-pay | Admitting: Gastroenterology

## 2021-07-21 ENCOUNTER — Ambulatory Visit (INDEPENDENT_AMBULATORY_CARE_PROVIDER_SITE_OTHER): Payer: BC Managed Care – PPO | Admitting: Gastroenterology

## 2021-07-21 VITALS — BP 122/80 | HR 72 | Ht 74.0 in | Wt 210.2 lb

## 2021-07-21 DIAGNOSIS — R1030 Lower abdominal pain, unspecified: Secondary | ICD-10-CM | POA: Diagnosis not present

## 2021-07-21 DIAGNOSIS — Z1211 Encounter for screening for malignant neoplasm of colon: Secondary | ICD-10-CM | POA: Diagnosis not present

## 2021-07-21 DIAGNOSIS — R911 Solitary pulmonary nodule: Secondary | ICD-10-CM | POA: Insufficient documentation

## 2021-07-21 MED ORDER — DICYCLOMINE HCL 20 MG PO TABS: 20.0000 mg | ORAL_TABLET | Freq: Four times a day (QID) | ORAL | 1 refills | Status: DC

## 2021-07-21 MED ORDER — SUTAB 1479-225-188 MG PO TABS: 1.0000 | ORAL_TABLET | Freq: Once | ORAL | 0 refills | Status: AC

## 2021-07-21 NOTE — Progress Notes (Signed)
HPI : Garrett Caldwell is a very pleasant 45 year old male who is referred to [place] by Jarold Motto for further evaluation of abdominal pain.  Patient states that he has been having abdominal pain for little over a month now.  It is located in his lower mid abdomen below his bellybutton.  He describes it as a dull achy pain, which sometimes last minutes and sometimes last for hours.  He has been experiencing the pain most days of the week in the past month.  The pain is not usually severe and does not interfere with his daily tasks, but it is bothersome.  He has not noticed any identifiable triggers for the pain.  The pain does not seem to be related to eating and does not vary with bowel movements.  He denies any nocturnal symptoms.  No associated symptoms such as nausea, vomiting or diaphoresis.  He has regular bowel movements, usually 2-3 stools per day.  Stools are usually formed and solid, but sometimes he will have loose stools.  No blood in the stool. He has tried changing his diet a little but this does not seem to have had any effect.  He denies any major changes in his diet or medications that preceded the of his symptoms. He has chronic heartburn treated with omeprazole.  When he would stop the omeprazole, his symptoms would return.  He was recently switched to Protonix because of this pain but this has not had no effect on his pain.  His GERD continues to be well controlled.  He was seen by Dr. Jena Gauss in 2010 with symptoms of chronic abdominal pain diarrhea and blood in the stool.  He had a normal CT and extensive stool and blood test evaluation at that time.  He had a colonoscopy which apparently was normal per the patient (the report I found scanned into the EMR was hand written and illegible).   More recently, a CBC, CMP lipase were unremarkable.  A H. pylori breath test was negative. A CT abd/pelvis yesterday was unremarkable.   Past Medical History:  Diagnosis Date   Allergy     Arthritis    GERD (gastroesophageal reflux disease)    History of chicken pox    History of shingles    MI (myocardial infarction) (HCC) 2013     History reviewed. No pertinent surgical history. Family History  Problem Relation Age of Onset   Prostate cancer Father        mets to body   Social History   Tobacco Use   Smoking status: Former   Smokeless tobacco: Never   Tobacco comments:    quit 2017  Vaping Use   Vaping Use: Never used  Substance Use Topics   Alcohol use: Never   Drug use: Never   Current Outpatient Medications  Medication Sig Dispense Refill   esomeprazole (NEXIUM) 40 MG capsule Take 1 capsule (40 mg total) by mouth daily. 30 capsule 3   ibuprofen (ADVIL,MOTRIN) 200 MG tablet Take 800 mg by mouth every 6 (six) hours as needed for moderate pain.     methocarbamol (ROBAXIN) 500 MG tablet Take 1 tablet (500 mg total) by mouth at bedtime as needed for muscle spasms. 30 tablet 0   potassium chloride SA (KLOR-CON) 20 MEQ tablet Take 1 tablet (20 mEq total) by mouth daily. (Patient not taking: Reported on 07/13/2021) 30 tablet 0   prednisoLONE acetate (PRED FORTE) 1 % ophthalmic suspension SMARTSIG:1 Drop(s) Left Eye Every Other Day  traZODone (DESYREL) 50 MG tablet trazodone 50 mg tablet     valACYclovir (VALTREX) 1000 MG tablet Take 1 tablet (1,000 mg total) by mouth daily. 90 tablet 1   No current facility-administered medications for this visit.   Allergies  Allergen Reactions   Bee Venom Anaphylaxis   Penicillins Hives and Swelling     Review of Systems: All systems reviewed and negative except where noted in HPI.    DG Abd 1 View  Result Date: 07/01/2021 CLINICAL DATA:  Periumbilical pain for 3 weeks EXAM: ABDOMEN - 1 VIEW COMPARISON:  04/10/2015 renal stone CT FINDINGS: Normal bowel gas pattern. No excessive stool retention with stool mainly seen in the ascending colon and rectum. No concerning mass effect, gas collection, or calcification. No  osseous findings. IMPRESSION: Unremarkable abdominal series. Electronically Signed   By: Tiburcio Pea M.D.   On: 07/01/2021 22:51   CT Abdomen Pelvis W Contrast  Result Date: 07/20/2021 CLINICAL DATA:  1 month history of low abdominal pain. EXAM: CT ABDOMEN AND PELVIS WITH CONTRAST TECHNIQUE: Multidetector CT imaging of the abdomen and pelvis was performed using the standard protocol following bolus administration of intravenous contrast. CONTRAST:  26mL OMNIPAQUE IOHEXOL 350 MG/ML SOLN COMPARISON:  04/10/2015 FINDINGS: Lower chest: Tiny pulmonary nodules in the left lung base on images 20 and 24 are stable since prior consistent with benign etiology. 4 mm left lower lobe nodule on 31/4 was not visible on the previous exam. Hepatobiliary: No suspicious focal abnormality within the liver parenchyma. There is no evidence for gallstones, gallbladder wall thickening, or pericholecystic fluid. No intrahepatic or extrahepatic biliary dilation. Pancreas: No focal mass lesion. No dilatation of the main duct. No intraparenchymal cyst. No peripancreatic edema. Spleen: No splenomegaly. No focal mass lesion. Adrenals/Urinary Tract: No adrenal nodule or mass. Kidneys unremarkable. No evidence for hydroureter. The urinary bladder appears normal for the degree of distention. Stomach/Bowel: Stomach is nondistended. Duodenum is normally positioned as is the ligament of Treitz. No small bowel wall thickening. No small bowel dilatation. The terminal ileum is normal. The appendix is normal. No gross colonic mass. No colonic wall thickening. Vascular/Lymphatic: There is mild atherosclerotic calcification of the abdominal aorta without aneurysm. There is no gastrohepatic or hepatoduodenal ligament lymphadenopathy. No retroperitoneal or mesenteric lymphadenopathy. No pelvic sidewall lymphadenopathy. Reproductive: Prostate gland upper normal for size. Other: No intraperitoneal free fluid. Musculoskeletal: No worrisome lytic or  sclerotic osseous abnormality. IMPRESSION: 1. No acute findings in the abdomen or pelvis. Specifically, no findings to explain the patient's history of lower abdominal pain. 2. 4 mm left lower lobe nodule on 31/4 was not visible on the previous exam. No follow-up needed if patient is low-risk. Non-contrast chest CT can be considered in 12 months if patient is high-risk. This recommendation follows the consensus statement: Guidelines for Management of Incidental Pulmonary Nodules Detected on CT Images: From the Fleischner Society 2017; Radiology 2017; 284:228-243. 3. Aortic Atherosclerosis (ICD10-I70.0). Electronically Signed   By: Kennith Center M.D.   On: 07/20/2021 17:30    Physical Exam: Vitals reviewed, unremarkable Constitutional: Pleasant,well-developed, Caucasian male in no acute distress. HEENT: Normocephalic and atraumatic. Conjunctivae are normal. No scleral icterus. Cardiovascular: Normal rate, regular rhythm.  Pulmonary/chest: Effort normal and breath sounds normal. No wheezing, rales or rhonchi. Abdominal: Soft, nondistended, focal tenderness to deep palpation in the right lower quadrant.  Patient states that this pain is not the same as the pain is been having the last month.  Pain was improved with abdominis rectus flexion.  Bowel sounds active throughout. There are no masses palpable. No hepatomegaly. Extremities: no edema Neurological: Alert and oriented to person place and time. Skin: Skin is warm and dry. No rashes noted. Psychiatric: Normal mood and affect. Behavior is normal.  CBC    Component Value Date/Time   WBC 7.3 06/29/2021 1345   RBC 4.68 06/29/2021 1345   HGB 13.8 06/29/2021 1345   HCT 42.1 06/29/2021 1345   PLT 193.0 06/29/2021 1345   MCV 89.9 06/29/2021 1345   MCH 30.0 03/05/2021 2001   MCHC 32.9 06/29/2021 1345   RDW 13.1 06/29/2021 1345   LYMPHSABS 1.8 06/29/2021 1345   MONOABS 0.8 06/29/2021 1345   EOSABS 0.0 06/29/2021 1345   BASOSABS 0.1 06/29/2021 1345     CMP     Component Value Date/Time   NA 140 07/13/2021 1017   K 4.4 07/13/2021 1017   CL 103 07/13/2021 1017   CO2 32 07/13/2021 1017   GLUCOSE 78 07/13/2021 1017   BUN 12 07/13/2021 1017   CREATININE 1.12 07/13/2021 1017   CREATININE 1.09 06/17/2020 1019   CALCIUM 9.5 07/13/2021 1017   PROT 6.6 06/29/2021 1345   ALBUMIN 4.2 06/29/2021 1345   AST 19 06/29/2021 1345   ALT 19 06/29/2021 1345   ALKPHOS 89 06/29/2021 1345   BILITOT 0.4 06/29/2021 1345   GFRNONAA >60 03/05/2021 2001     ASSESSMENT AND PLAN: 45 year old male with 1 month history of vague abdominal discomfort, not related to meals or bowel habits.  He has regular bowel habits without constipation or diarrhea.  No red flag symptoms.  He has had a normal work-up thus far to include CT abdomen pelvis, H. pylori breath test, CBC/CMP/lipase.  He has a history of abdominal pain and diarrhea with blood in his stool over 10 years ago which resolved spontaneously.  An extensive work-up at that time was negative.  Symptoms he was having in 2010 seem very consistent with IBS.  His current symptoms are less consistent with IBS given that he has normal bowel habits.  Pain does not seem to be very consistent with abdominal wall pain syndrome.  Crohn's disease still possible, but would be unlikely in the setting of a normal CT.  The patient needs a colonoscopy for colon cancer screening, but we will also exclude ileal Crohn's disease as a cause of his persistent pain.  Will prescribe Bentyl as needed for his pain in the meantime.  Patient can follow-up in clinic after his colonoscopy for further evaluation and treatment.  Abdominal pain - Colonoscopy - Bentyl 20 mg p.o. every 6 hours as needed for pain  Colon cancer screening - Colonoscopy  The details, risks (including bleeding, perforation, infection, missed lesions, medication reactions and possible hospitalization or surgery if complications occur), benefits, and alternatives to  colonoscopy with possible biopsy and possible polypectomy were discussed with the patient and he consents to proceed.   Reginia Battie E. Tomasa Rand, MD [facility]   CC: Jarold Motto, [place]

## 2021-07-21 NOTE — Patient Instructions (Signed)
If you are age 47 or older, your body mass index should be between 23-30. Your Body mass index is 26.99 kg/m. If this is out of the aforementioned range listed, please consider follow up with your Primary Care Provider.  If you are age 62 or younger, your body mass index should be between 19-25. Your Body mass index is 26.99 kg/m. If this is out of the aformentioned range listed, please consider follow up with your Primary Care Provider.   Your bowel prep has been sent to [facility] they will deliver your prep to you for $40 .  We have sent the following medications to your pharmacy for you to pick up at your convenience: Bentyl 20 mg   You have been scheduled for a colonoscopy. Please follow written instructions given to you at your visit today.  Please pick up your prep supplies at the pharmacy within the next 1-3 days. If you use inhalers (even only as needed), please bring them with you on the day of your procedure.   The [facility] providers would like to encourage you to use [facility] to communicate with providers for non-urgent requests or questions.  Due to long hold times on the telephone, sending your provider a message by [facility] may be a faster and more efficient way to get a response.  Please allow 48 business hours for a response.  Please remember that this is for non-urgent requests.   Due to recent changes in healthcare laws, you may see the results of your imaging and laboratory studies on MyChart before your provider has had a chance to review them.  We understand that in some cases there may be results that are confusing or concerning to you. Not all laboratory results come back in the same time frame and the provider may be waiting for multiple results in order to interpret others.  Please give [place] 48 hours in order for your provider to thoroughly review all the results before contacting the office for clarification of your results.   It was a pleasure to see you  today!  Thank you for trusting me with your gastrointestinal care!    Scott E.Tomasa Rand

## 2021-07-24 ENCOUNTER — Other Ambulatory Visit: Payer: Self-pay | Admitting: *Deleted

## 2021-07-24 ENCOUNTER — Telehealth: Payer: Self-pay

## 2021-07-24 MED ORDER — ATORVASTATIN CALCIUM 40 MG PO TABS: 40.0000 mg | ORAL_TABLET | Freq: Every day | ORAL | 1 refills | Status: AC

## 2021-07-24 NOTE — Telephone Encounter (Signed)
Garrett Caldwell, pt's wife called back she said pt is concerned and anxious about nodule found in lung due to smoking hx for over 20 yrs and also father had prostate cancer, liver and then lung cancer. Wants to know if CT of chest should be done now? Please advise.

## 2021-07-24 NOTE — Telephone Encounter (Signed)
Pt's wife Lebron Conners called back, told her Lelon Mast said Pulmonary nodules are quite common and often incidental findings on imaging studies. There are guidelines written for management of incidental pulmonary nodules detected on CT images. The recommendation based on Alan's findings is to "consider non-contrast chest CT in 12 months." If she would like a second opinion, we can refer to pulmonary. Lebron Conners verbalized understanding and will discuss with patient and let us know if he wants referral.

## 2021-07-24 NOTE — Telephone Encounter (Signed)
See other message

## 2021-07-24 NOTE — Telephone Encounter (Signed)
Pt's wife called the office back.

## 2021-07-24 NOTE — Telephone Encounter (Signed)
Left message on voicemail to call office.  

## 2021-07-24 NOTE — Telephone Encounter (Signed)
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 He wanted her to call about lung nodule. Told Garrett Caldwell CT scan shows normal abdomen/pelvis -- nothing concerning causing his symptoms. Needs to follow-up with GI and he told me he saw GI on Friday.   There is evidence of plaque in his aorta. As was mentioned during our visit in September 2021, it is highly recommended that he start cholesterol medication given his history of heart attack. Told her he was agreeable to starting Lipitor 40 mg daily and Rx was sent to pharmacy. Also there was an incidental finding of a pulmonary nodule found in his left lung. Due to history of smoking, we do need to obtain a chest CT in 1 year to follow-up on this. Garrett Caldwell verbalized understanding and said pt was concerned due to smoking hx and his father had prostate cancer, then liver cancer that spread to lung. Pt is anxious about nodule. Told her I will send Garrett Caldwell a message to see if she wants to order Chest CT now and get back to her. Garrett Caldwell verbalized understanding.

## 2021-07-24 NOTE — Telephone Encounter (Signed)
Patient's spouse is calling in and was advised from husband to call about lab results, wife says he was at work and didn't understand what was all said.

## 2021-07-24 NOTE — Telephone Encounter (Signed)
Pt's wife called back [place]. Tried to call her back Left message on voicemail to call office.

## 2021-07-26 ENCOUNTER — Encounter: Payer: Self-pay | Admitting: Physician Assistant

## 2021-07-27 ENCOUNTER — Other Ambulatory Visit: Payer: Self-pay | Admitting: Family

## 2021-08-25 ENCOUNTER — Telehealth: Payer: Self-pay | Admitting: Gastroenterology

## 2021-08-25 ENCOUNTER — Encounter: Payer: BC Managed Care – PPO | Admitting: Gastroenterology

## 2021-09-23 DIAGNOSIS — J069 Acute upper respiratory infection, unspecified: Secondary | ICD-10-CM | POA: Diagnosis not present

## 2021-09-23 DIAGNOSIS — Z03818 Encounter for observation for suspected exposure to other biological agents ruled out: Secondary | ICD-10-CM | POA: Diagnosis not present

## 2021-10-19 ENCOUNTER — Other Ambulatory Visit: Payer: Self-pay | Admitting: Family

## 2021-10-20 ENCOUNTER — Other Ambulatory Visit: Payer: Self-pay

## 2021-10-26 ENCOUNTER — Encounter: Payer: Self-pay | Admitting: Physician Assistant

## 2021-10-26 ENCOUNTER — Other Ambulatory Visit: Payer: Self-pay

## 2021-10-26 ENCOUNTER — Ambulatory Visit (INDEPENDENT_AMBULATORY_CARE_PROVIDER_SITE_OTHER): Payer: BC Managed Care – PPO | Admitting: Physician Assistant

## 2021-10-26 VITALS — BP 139/80 | HR 79 | Temp 98.0°F | Ht 74.0 in | Wt 214.6 lb

## 2021-10-26 DIAGNOSIS — Z0000 Encounter for general adult medical examination without abnormal findings: Secondary | ICD-10-CM | POA: Diagnosis not present

## 2021-10-26 DIAGNOSIS — K529 Noninfective gastroenteritis and colitis, unspecified: Secondary | ICD-10-CM

## 2021-10-26 DIAGNOSIS — K219 Gastro-esophageal reflux disease without esophagitis: Secondary | ICD-10-CM | POA: Diagnosis not present

## 2021-10-26 DIAGNOSIS — B0230 Zoster ocular disease, unspecified: Secondary | ICD-10-CM

## 2021-10-26 DIAGNOSIS — Z8042 Family history of malignant neoplasm of prostate: Secondary | ICD-10-CM | POA: Diagnosis not present

## 2021-10-26 DIAGNOSIS — I219 Acute myocardial infarction, unspecified: Secondary | ICD-10-CM | POA: Diagnosis not present

## 2021-10-26 DIAGNOSIS — G720 Drug-induced myopathy: Secondary | ICD-10-CM | POA: Diagnosis not present

## 2021-10-26 DIAGNOSIS — E785 Hyperlipidemia, unspecified: Secondary | ICD-10-CM

## 2021-10-26 DIAGNOSIS — T466X5A Adverse effect of antihyperlipidemic and antiarteriosclerotic drugs, initial encounter: Secondary | ICD-10-CM

## 2021-10-26 LAB — COMPREHENSIVE METABOLIC PANEL
ALT: 17 U/L (ref 0–53)
AST: 17 U/L (ref 0–37)
Albumin: 4.4 g/dL (ref 3.5–5.2)
Alkaline Phosphatase: 100 U/L (ref 39–117)
BUN: 14 mg/dL (ref 6–23)
CO2: 31 mEq/L (ref 19–32)
Calcium: 9.9 mg/dL (ref 8.4–10.5)
Chloride: 102 mEq/L (ref 96–112)
Creatinine, Ser: 1.12 mg/dL (ref 0.40–1.50)
GFR: 78.47 mL/min (ref >60.00)
Glucose, Bld: 80 mg/dL (ref 70–99)
Potassium: 4.8 mEq/L (ref 3.5–5.1)
Sodium: 142 mEq/L (ref 135–145)
Total Bilirubin: 0.4 mg/dL (ref 0.2–1.2)
Total Protein: 6.9 g/dL (ref 6.0–8.3)

## 2021-10-26 LAB — CBC WITH DIFFERENTIAL/PLATELET
Basophils Absolute: 0 10*3/uL (ref 0.0–0.1)
Basophils Relative: 1.1 % (ref 0.0–3.0)
Eosinophils Absolute: 0.1 10*3/uL (ref 0.0–0.7)
Eosinophils Relative: 1.3 % (ref 0.0–5.0)
HCT: 44.1 % (ref 39.0–52.0)
Hemoglobin: 14.1 g/dL (ref 13.0–17.0)
Lymphocytes Relative: 34.9 % (ref 12.0–46.0)
Lymphs Abs: 1.5 10*3/uL (ref 0.7–4.0)
MCHC: 32.1 g/dL (ref 30.0–36.0)
MCV: 90.9 fl (ref 78.0–100.0)
Monocytes Absolute: 0.6 10*3/uL (ref 0.1–1.0)
Monocytes Relative: 14 % — ABNORMAL HIGH (ref 3.0–12.0)
Neutro Abs: 2.1 10*3/uL (ref 1.4–7.7)
Neutrophils Relative %: 48.7 % (ref 43.0–77.0)
Platelets: 192 10*3/uL (ref 150.0–400.0)
RBC: 4.85 Mil/uL (ref 4.22–5.81)
RDW: 13.2 % (ref 11.5–15.5)
WBC: 4.3 10*3/uL (ref 4.0–10.5)

## 2021-10-26 LAB — LIPID PANEL
Cholesterol: 233 mg/dL — ABNORMAL HIGH (ref 0–200)
HDL: 38.3 mg/dL — ABNORMAL LOW (ref >39.00)
NonHDL: 195.17
Total CHOL/HDL Ratio: 6
Triglycerides: 284 mg/dL — ABNORMAL HIGH (ref 0.0–149.0)
VLDL: 56.8 mg/dL — ABNORMAL HIGH (ref 0.0–40.0)

## 2021-10-26 LAB — LDL CHOLESTEROL, DIRECT: Direct LDL: 135 mg/dL

## 2021-10-26 LAB — PSA: PSA: 0.8 ng/mL (ref 0.10–4.00)

## 2021-10-26 MED ORDER — ESOMEPRAZOLE MAGNESIUM 40 MG PO CPDR: DELAYED_RELEASE_CAPSULE | ORAL | 3 refills | Status: DC

## 2021-10-26 MED ORDER — VALACYCLOVIR HCL 1 G PO TABS
1000.0000 mg | ORAL_TABLET | Freq: Every day | ORAL | 3 refills | Status: DC
Start: 2021-10-26 — End: 2021-12-22

## 2021-10-26 NOTE — Patient Instructions (Addendum)
It was great to see you!  I have refilled your nexium and valtrex x 1 year Call [facility] to reschedule your colonoscopy: [phone]  I have put in a referral to the [facility] to talk about how to best manage your cholesterol since you did not tolerate the lipitor  Please go to the lab for blood work.   Our office will call you with your results unless you have chosen to receive results via MyChart.  If your blood work is normal we will follow-up each year for physicals and as scheduled for chronic medical problems.  If anything is abnormal we will treat accordingly and get you in for a follow-up.  Take care,  Lelon Mast

## 2021-10-26 NOTE — Progress Notes (Signed)
Subjective:    Garrett Caldwell is a 46 y.o. male and is here for a comprehensive physical exam.  HPI  Health Maintenance Due  Topic Date Due   COVID-19 Vaccine (3 - Booster for Pfizer series) 03/11/2020   Acute Concerns: None discussed at this time.  Chronic Issues: GERD Jarard is compliant with taking nexium 40 mg daily with no adverse effects. He is tolerating well.   HLD Pt is currently non-compliant with taking lipitor 40 mg daily. States that while on this medication, he found that he felt more exhaustion upon doing physical activity. Due to this he stopped the medication and found the exhaustion had resolved itself. Pt does have a hx of MI and is interested in following up with cardiology for more medication options, if needed. Denies SOB or chest pain.    IBD At this time pt states he does still have some flare-up episodes but believes this is due to his poor diet. Despite this he is compliant with taking Bentyl 20 mg daily and is managing well.   Hx of Shingles Currently compliant with taking valtrex 1000 mg daily with no adverse effects. States that as long as he is on the medication, he experiences no flare-ups and is managing well.   Health Maintenance: Immunizations -- Covid- UTD Influenza-  Declined Tdap- UTD; 2020 Colonoscopy -- Due PSA --  Lab Results  Component Value Date   PSA 0.7 06/17/2020   Diet -- Eats all food groups Dentistry- Upcoming Appt Ophthalmology- Upcoming Appt Sleep habits -- Normal schedule Exercise -- Not currently outside of work Weight -- Stable Weight history Wt Readings from Last 10 Encounters:  10/26/21 214 lb 9.6 oz (97.3 kg)  07/21/21 210 lb 3.2 oz (95.3 kg)  07/13/21 212 lb 8 oz (96.4 kg)  06/29/21 208 lb 6.4 oz (94.5 kg)  03/05/21 215 lb (97.5 kg)  12/12/20 209 lb (94.8 kg)  06/17/20 209 lb 4 oz (94.9 kg)  04/10/15 200 lb (90.7 kg)   Body mass index is 27.55 kg/m. Mood -- Stable Tobacco use --  Tobacco Use:  Medium Risk   Smoking Tobacco Use: Former   Smokeless Tobacco Use: Never   Passive Exposure: Not on file    Alcohol use ---  reports that he does not currently use alcohol.   Depression screen PHQ 2/9 06/29/2021  Decreased Interest 0  Down, Depressed, Hopeless 0  PHQ - 2 Score 0     Other providers/specialists: Patient Care Team: Jarold Motto, [place] as PCP - General (Physician Assistant)   PMHx, SurgHx, SocialHx, Medications, and Allergies were reviewed in the Visit Navigator and updated as appropriate.   Past Medical History:  Diagnosis Date   Allergy    Arthritis    GERD (gastroesophageal reflux disease)    History of chicken pox    History of shingles    MI (myocardial infarction) (HCC) 2013     Past Surgical History:  Procedure Laterality Date   COLONOSCOPY  2010     Family History  Problem Relation Age of Onset   Prostate cancer Father        mets to body   Lung cancer Maternal Grandmother    Prostate cancer Maternal Grandfather    Prostate cancer Paternal Grandfather    Prostate cancer Maternal Uncle    Prostate cancer Paternal Uncle     Social History   Tobacco Use   Smoking status: Former   Smokeless tobacco: Never   Tobacco comments:  quit 2017  Vaping Use   Vaping Use: Never used  Substance Use Topics   Alcohol use: Not Currently   Drug use: Never    Review of Systems:   Review of Systems  Constitutional:  Negative for chills, fever, malaise/fatigue and weight loss.  HENT:  Negative for hearing loss, sinus pain and sore throat.   Respiratory:  Negative for cough and hemoptysis.   Cardiovascular:  Negative for chest pain, palpitations, leg swelling and PND.  Gastrointestinal:  Negative for abdominal pain, constipation, diarrhea, heartburn, nausea and vomiting.  Genitourinary:  Negative for dysuria, frequency and urgency.  Musculoskeletal:  Negative for back pain, myalgias and neck pain.  Skin:  Negative for itching and rash.   Neurological:  Negative for dizziness, tingling, seizures and headaches.  Endo/Heme/Allergies:  Negative for polydipsia.  Psychiatric/Behavioral:  Negative for depression. The patient is not nervous/anxious.    Objective:   Vitals:   10/26/21 0902  BP: 139/80  Pulse: 79  Temp: 98 F (36.7 C)  SpO2: 98%   Body mass index is 27.55 kg/m.  General Appearance:  Alert, cooperative, no distress, appears stated age  Head:  Normocephalic, without obvious abnormality, atraumatic  Eyes:  PERRL, conjunctiva/corneas clear, EOM's intact, fundi benign, both eyes       Ears:  Normal TM's and external ear canals, both ears  Nose: Nares normal, septum midline, mucosa normal, no drainage    or sinus tenderness  Throat: Lips, mucosa, and tongue normal; teeth and gums normal  Neck: Supple, symmetrical, trachea midline, no adenopathy; thyroid:  No enlargement/tenderness/nodules; no carotit bruit or JVD  Back:   Symmetric, no curvature, ROM normal, no CVA tenderness  Lungs:   Clear to auscultation bilaterally, respirations unlabored  Chest wall:  No tenderness or deformity  Heart:  Regular rate and rhythm, S1 and S2 normal, no murmur, rub   or gallop  Abdomen:   Soft, non-tender, bowel sounds active all four quadrants, no masses, no organomegaly  Extremities: Extremities normal, atraumatic, no cyanosis or edema  Prostate: Not done.   Skin: Skin color, texture, turgor normal, no rashes or lesions  Lymph nodes: Cervical, supraclavicular, and axillary nodes normal  Neurologic: CNII-XII grossly intact. Normal strength, sensation and reflexes throughout    Assessment/Plan:   Routine physical examination Today patient counseled on age appropriate routine health concerns for screening and prevention, each reviewed and up to date or declined. Immunizations reviewed and up to date or declined. Labs ordered and reviewed. Risk factors for depression reviewed and negative. Hearing function and visual acuity  are intact. ADLs screened and addressed as needed. Functional ability and level of safety reviewed and appropriate. Education, counseling and referrals performed based on assessed risks today. Patient provided with a copy of personalized plan for preventive services.  Encouraged patient to follow-up with GI for colonoscopy.  Gastroesophageal reflux disease, unspecified whether esophagitis present Controlled  Continue Nexium 40 mg daily I advised patient to avoid spicy and high acidic foods for prevention of symptoms Follow up as needed  Myocardial infarction, unspecified MI type, unspecified artery ([place]); Statin Myopathy No red flags on exam Update labs today Referral to lipid clinic for further evaluation and mgmt  Family history of prostate cancer Update labs today, will make recommendations accordingly  - PSA   Patient Counseling: [x]   Nutrition: Stressed importance of moderation in sodium/caffeine intake, saturated fat and cholesterol, caloric balance, sufficient intake of fresh fruits, vegetables, and fiber.  [x]   Stressed the importance of regular  exercise.   []   Substance Abuse: Discussed cessation/primary prevention of tobacco, alcohol, or other drug use; driving or other dangerous activities under the influence; availability of treatment for abuse.   [x]   Injury prevention: Discussed safety belts, safety helmets, smoke detector, smoking near bedding or upholstery.   []   Sexuality: Discussed sexually transmitted diseases, partner selection, use of condoms, avoidance of unintended pregnancy  and contraceptive alternatives.   [x]   Dental health: Discussed importance of regular tooth brushing, flossing, and dental visits.  [x]   Health maintenance and immunizations reviewed. Please refer to Health maintenance section.    I,Havlyn C Ratchford,acting as a Education administrator for [employer], PA.,have documented all relevant documentation on the behalf of Inda Coke, PA,as directed by   Inda Coke, PA while in the presence of Inda Coke, [place].  I, Inda Coke, [place], have reviewed all documentation for this visit. The documentation on 10/26/21 for the exam, diagnosis, procedures, and orders are all accurate and complete.  Inda Coke, PA-C [place]

## 2021-10-27 ENCOUNTER — Other Ambulatory Visit: Payer: Self-pay | Admitting: *Deleted

## 2021-10-27 DIAGNOSIS — I219 Acute myocardial infarction, unspecified: Secondary | ICD-10-CM

## 2021-10-27 DIAGNOSIS — E785 Hyperlipidemia, unspecified: Secondary | ICD-10-CM

## 2021-10-27 MED ORDER — EZETIMIBE 10 MG PO TABS: 10.0000 mg | ORAL_TABLET | Freq: Every day | ORAL | 0 refills | Status: DC

## 2021-10-27 NOTE — Progress Notes (Signed)
Amb to  ?

## 2021-12-22 ENCOUNTER — Other Ambulatory Visit: Payer: Self-pay | Admitting: Physician Assistant

## 2022-01-18 ENCOUNTER — Other Ambulatory Visit: Payer: Self-pay | Admitting: Physician Assistant

## 2022-04-19 ENCOUNTER — Other Ambulatory Visit: Payer: Self-pay | Admitting: Physician Assistant

## 2022-04-27 ENCOUNTER — Ambulatory Visit: Payer: BC Managed Care – PPO | Admitting: Internal Medicine

## 2022-07-22 ENCOUNTER — Other Ambulatory Visit: Payer: Self-pay | Admitting: Physician Assistant

## 2022-11-04 ENCOUNTER — Other Ambulatory Visit: Payer: Self-pay | Admitting: Physician Assistant

## 2022-12-06 ENCOUNTER — Other Ambulatory Visit: Payer: Self-pay | Admitting: Physician Assistant

## 2023-01-18 ENCOUNTER — Other Ambulatory Visit: Payer: Self-pay | Admitting: Physician Assistant

## 2023-01-22 NOTE — Progress Notes (Signed)
Garrett Caldwell is a 47 y.o. male here for a new problem.  History of Present Illness:   No chief complaint on file.   HPI  Congestion, sore throat, ear pain since    Past Medical History:  Diagnosis Date   Allergy    Arthritis    GERD (gastroesophageal reflux disease)    History of chicken pox    History of shingles    MI (myocardial infarction) (HCC) 2013     Social History   Tobacco Use   Smoking status: Former   Smokeless tobacco: Never   Tobacco comments:    quit 2017  Vaping Use   Vaping Use: Never used  Substance Use Topics   Alcohol use: Not Currently   Drug use: Never    Past Surgical History:  Procedure Laterality Date   COLONOSCOPY  2010    Family History  Problem Relation Age of Onset   Prostate cancer Father        mets to body   Lung cancer Maternal Grandmother    Prostate cancer Maternal Grandfather    Prostate cancer Paternal Grandfather    Prostate cancer Maternal Uncle    Prostate cancer Paternal Uncle     Allergies  Allergen Reactions   Bee Venom Anaphylaxis   Penicillins Hives and Swelling    Current Medications:   Current Outpatient Medications:    atorvastatin (LIPITOR) 40 MG tablet, Take 1 tablet (40 mg total) by mouth daily. (Patient not taking: Reported on 10/26/2021), Disp: 90 tablet, Rfl: 1   dicyclomine (BENTYL) 20 MG tablet, Take 1 tablet (20 mg total) by mouth every 6 (six) hours. (Patient not taking: Reported on 10/26/2021), Disp: 30 tablet, Rfl: 1   esomeprazole (NEXIUM) 40 MG capsule, TAKE 1 CAPSULE(40 MG) BY MOUTH DAILY, Disp: 90 capsule, Rfl: 3   ezetimibe (ZETIA) 10 MG tablet, TAKE 1 TABLET BY MOUTH EVERY DAY, Disp: 90 tablet, Rfl: 1   ibuprofen (ADVIL,MOTRIN) 200 MG tablet, Take 800 mg by mouth every 6 (six) hours as needed for moderate pain. (Patient not taking: Reported on 10/26/2021), Disp: , Rfl:    methocarbamol (ROBAXIN) 500 MG tablet, Take 1 tablet (500 mg total) by mouth at bedtime as needed for muscle  spasms. (Patient not taking: Reported on 10/26/2021), Disp: 30 tablet, Rfl: 0   potassium chloride SA (KLOR-CON) 20 MEQ tablet, TAKE 1 TABLET BY MOUTH EVERY DAY (Patient not taking: Reported on 10/26/2021), Disp: 30 tablet, Rfl: 0   prednisoLONE acetate (PRED FORTE) 1 % ophthalmic suspension, SMARTSIG:1 Drop(s) Left Eye Every Other Day (Patient not taking: Reported on 10/26/2021), Disp: , Rfl:    traZODone (DESYREL) 50 MG tablet, trazodone 50 mg tablet (Patient not taking: Reported on 10/26/2021), Disp: , Rfl:    valACYclovir (VALTREX) 1000 MG tablet, TAKE 1 TABLET BY MOUTH EVERY DAY, Disp: 90 tablet, Rfl: 0   Review of Systems:   ROS  Vitals:   There were no vitals filed for this visit.   There is no height or weight on file to calculate BMI.  Physical Exam:   Physical Exam  Assessment and Plan:   ***   I,Alexander Ruley,acting as a scribe for Jarold Motto, PA.,have documented all relevant documentation on the behalf of Jarold Motto, PA,as directed by  Jarold Motto, PA while in the presence of Jarold Motto, [place].   ***   Jarold Motto, PA-C

## 2023-01-23 ENCOUNTER — Ambulatory Visit (INDEPENDENT_AMBULATORY_CARE_PROVIDER_SITE_OTHER): Payer: BC Managed Care – PPO | Admitting: Physician Assistant

## 2023-01-23 ENCOUNTER — Encounter: Payer: Self-pay | Admitting: Physician Assistant

## 2023-01-23 VITALS — BP 122/80 | HR 72 | Temp 98.0°F | Ht 74.0 in | Wt 208.0 lb

## 2023-01-23 DIAGNOSIS — J029 Acute pharyngitis, unspecified: Secondary | ICD-10-CM

## 2023-01-23 LAB — POCT RAPID STREP A (OFFICE): Rapid Strep A Screen: NEGATIVE

## 2023-01-23 LAB — POC COVID19 BINAXNOW: SARS Coronavirus 2 Ag: NEGATIVE

## 2023-01-23 MED ORDER — DOXYCYCLINE HYCLATE 100 MG PO TABS: 100.0000 mg | ORAL_TABLET | Freq: Two times a day (BID) | ORAL | 0 refills | Status: DC

## 2023-01-23 MED ORDER — FLUTICASONE PROPIONATE 50 MCG/ACT NA SUSP: 2.0000 | Freq: Every day | NASAL | 6 refills | Status: DC

## 2023-01-23 NOTE — Patient Instructions (Signed)
It was great to see you!  Start doxycycline antibiotic  Continue flonase  Follow-up if new/worsening symptoms  Take care,  Jarold Motto PA-C

## 2023-02-20 DIAGNOSIS — Z87891 Personal history of nicotine dependence: Secondary | ICD-10-CM | POA: Diagnosis not present

## 2023-02-20 DIAGNOSIS — K5730 Diverticulosis of large intestine without perforation or abscess without bleeding: Secondary | ICD-10-CM | POA: Diagnosis not present

## 2023-02-20 DIAGNOSIS — K353 Acute appendicitis with localized peritonitis, without perforation or gangrene: Secondary | ICD-10-CM | POA: Diagnosis not present

## 2023-02-20 DIAGNOSIS — I252 Old myocardial infarction: Secondary | ICD-10-CM | POA: Diagnosis not present

## 2023-02-20 DIAGNOSIS — Z880 Allergy status to penicillin: Secondary | ICD-10-CM | POA: Diagnosis not present

## 2023-02-20 DIAGNOSIS — K3589 Other acute appendicitis without perforation or gangrene: Secondary | ICD-10-CM | POA: Diagnosis not present

## 2023-02-20 DIAGNOSIS — R109 Unspecified abdominal pain: Secondary | ICD-10-CM | POA: Diagnosis not present

## 2023-02-20 DIAGNOSIS — K219 Gastro-esophageal reflux disease without esophagitis: Secondary | ICD-10-CM | POA: Diagnosis not present

## 2023-02-20 DIAGNOSIS — K3580 Unspecified acute appendicitis: Secondary | ICD-10-CM | POA: Diagnosis not present

## 2023-03-06 ENCOUNTER — Other Ambulatory Visit: Payer: Self-pay | Admitting: Physician Assistant

## 2023-03-14 DIAGNOSIS — Z09 Encounter for follow-up examination after completed treatment for conditions other than malignant neoplasm: Secondary | ICD-10-CM | POA: Diagnosis not present

## 2023-03-14 DIAGNOSIS — Z1331 Encounter for screening for depression: Secondary | ICD-10-CM | POA: Diagnosis not present

## 2023-05-31 ENCOUNTER — Other Ambulatory Visit: Payer: Self-pay | Admitting: Physician Assistant

## 2023-06-26 NOTE — Progress Notes (Shared)
Subjective:    Garrett Caldwell is a 47 y.o. male and is here for a comprehensive physical exam.  HPI  Health Maintenance Due  Topic Date Due   Colonoscopy  Never done   INFLUENZA VACCINE  Never done   COVID-19 Vaccine (3 - 2023-24 season) 06/09/2023    Acute Concerns: ***  Chronic Issues: Allergies  Arthritis  GERD Treatd with esomeprazole 40 mg daily.   Indigestion Manages his symptoms with his diet.  Has had colonoscopy and EGD.  X-ray abdomen on 06/30/21 unremarkable. CT abdomen and pelvis 07/20/21 showed no acute findings in the abdomen or pelvis, 4 mm left lower lobe nodule.  Hyperlipidemia Treated with atorvastatin 40 mg daily.  Anxiety  Treated with trazodone 50 mg.    Health Maintenance: Immunizations -- UTD on tetanus vaccine. Colonoscopy -- Due for repeat. PSA --  Lab Results  Component Value Date   PSA 0.80 10/26/2021   PSA 0.7 06/17/2020   Diet -- *** Sleep habits -- *** Exercise -- ***  Weight -- @FLOWAMB (14)@  Recent weight history Wt Readings from Last 10 Encounters:  01/23/23 208 lb (94.3 kg)  10/26/21 214 lb 9.6 oz (97.3 kg)  07/21/21 210 lb 3.2 oz (95.3 kg)  07/13/21 212 lb 8 oz (96.4 kg)  06/29/21 208 lb 6.4 oz (94.5 kg)  03/05/21 215 lb (97.5 kg)  12/12/20 209 lb (94.8 kg)  06/17/20 209 lb 4 oz (94.9 kg)  04/10/15 200 lb (90.7 kg)   There is no height or weight on file to calculate BMI.  Mood -- *** Alcohol use --  reports that he does not currently use alcohol.  Tobacco use --  Tobacco Use: Medium Risk (01/23/2023)   Patient History    Smoking Tobacco Use: Former    Smokeless Tobacco Use: Never    Passive Exposure: Not on file    Eligible for Low Dose CT?  UTD with eye doctor? *** UTD with dentist? ***     01/23/2023   10:48 AM  Depression screen PHQ 2/9  Decreased Interest 0  Down, Depressed, Hopeless 0  PHQ - 2 Score 0    Other providers/specialists: Patient Care Team: Jarold Motto, [place] as PCP -  General (Physician Assistant)    PMHx, SurgHx, SocialHx, Medications, and Allergies were reviewed in the Visit Navigator and updated as appropriate.   Past Medical History:  Diagnosis Date   Allergy    Arthritis    GERD (gastroesophageal reflux disease)    History of chicken pox    History of shingles    MI (myocardial infarction) (HCC) 2013     Past Surgical History:  Procedure Laterality Date   COLONOSCOPY  2010     Family History  Problem Relation Age of Onset   Prostate cancer Father        mets to body   Lung cancer Maternal Grandmother    Prostate cancer Maternal Grandfather    Prostate cancer Paternal Grandfather    Prostate cancer Maternal Uncle    Prostate cancer Paternal Uncle     Social History   Tobacco Use   Smoking status: Former   Smokeless tobacco: Never   Tobacco comments:    quit 2017  Vaping Use   Vaping status: Never Used  Substance Use Topics   Alcohol use: Not Currently   Drug use: Never    Review of Systems:   ROS  Objective:   There were no vitals filed for this visit.  There  is no height or weight on file to calculate BMI.  General  Alert, cooperative, no distress, appears stated age  Head:  Normocephalic, without obvious abnormality, atraumatic  Eyes:  PERRL, conjunctiva/corneas clear, EOM's intact, fundi benign, both eyes       Ears:  Normal TM's and external ear canals, both ears  Nose: Nares normal, septum midline, mucosa normal, no drainage or sinus tenderness  Throat: Lips, mucosa, and tongue normal; teeth and gums normal  Neck: Supple, symmetrical, trachea midline, no adenopathy;     thyroid:  No enlargement/tenderness/nodules; no carotid bruit or JVD  Back:   Symmetric, no curvature, ROM normal, no CVA tenderness  Lungs:   Clear to auscultation bilaterally, respirations unlabored  Chest wall:  No tenderness or deformity  Heart:  Regular rate and rhythm, S1 and S2 normal, no murmur, rub or gallop  Abdomen:   Soft,  non-tender, bowel sounds active all four quadrants, no masses, no organomegaly  Extremities: Extremities normal, atraumatic, no cyanosis or edema  Prostate : ***   Skin: Skin color, texture, turgor normal, no rashes or lesions  Lymph nodes: Cervical, supraclavicular, and axillary nodes normal  Neurologic: CNII-XII grossly intact. Normal strength, sensation and reflexes throughout   AssessmentPlan:   ***   I,Alexander Ruley,acting as a scribe for [employer], PA.,have documented all relevant documentation on the behalf of Jarold Motto, PA,as directed by  Jarold Motto, PA while in the presence of Jarold Motto, [place].   ***    Jarold Motto, PA-C [facility]

## 2023-06-27 ENCOUNTER — Telehealth: Payer: Self-pay | Admitting: Physician Assistant

## 2023-06-27 MED ORDER — ESOMEPRAZOLE MAGNESIUM 40 MG PO CPDR: DELAYED_RELEASE_CAPSULE | ORAL | 3 refills | Status: DC

## 2023-06-27 NOTE — Telephone Encounter (Signed)
Left message on voicemail Rx was sent to pharmacy as requested.

## 2023-06-27 NOTE — Telephone Encounter (Signed)
PT HAD TO RESCHEDULE CPE BECAUSE HE HAS TO GO OUT OF TOWN FOR [facility] AND NEW APPT IS NOT UNTIL NOVEMBER AND HE NEEDS RX TO TAKE OUT OF TOWN.    Prescription Request  06/27/2023  LOV: 01/23/2023  What is the name of the medication or equipment?  esomeprazole (NEXIUM) 40 MG capsule   Have you contacted your pharmacy to request a refill? No   Which pharmacy would you like this sent to? [id number] - [place], [place] - [address] [place] [address] Phone: [phone] Fax: [phone]   Patient notified that their request is being sent to the clinical staff for review and that they should receive a response within 2 business days.   Please advise at Mobile [phone] (mobile)

## 2023-07-03 ENCOUNTER — Encounter: Payer: BC Managed Care – PPO | Admitting: Physician Assistant

## 2023-07-19 ENCOUNTER — Other Ambulatory Visit: Payer: Self-pay | Admitting: Physician Assistant

## 2023-07-20 ENCOUNTER — Other Ambulatory Visit: Payer: Self-pay | Admitting: Physician Assistant

## 2023-08-24 ENCOUNTER — Other Ambulatory Visit: Payer: Self-pay | Admitting: Physician Assistant

## 2023-08-30 ENCOUNTER — Encounter: Payer: BC Managed Care – PPO | Admitting: Physician Assistant

## 2023-09-10 NOTE — Progress Notes (Signed)
Garrett Caldwell is a 47 y.o. male and is here for a comprehensive physical exam.  HPI Health Maintenance Due  Topic Date Due   Colonoscopy  Never done   INFLUENZA VACCINE  Never done   COVID-19 Vaccine (3 - 2023-24 season) 06/09/2023   No chief complaint on file.  Acute Concerns: {ExamConcerns:31114}  Chronic Issues: {ExamConcerns:31114}  Health Maintenance: Immunizations -- *** Colonoscopy -- Last done 2010 with apparently normal results (overdue) PSA --  Lab Results  Component Value Date   PSA 0.80 10/26/2021   PSA 0.7 06/17/2020   Diet -- {CPE Diet/Exercise:30649} Sleep habits -- {CPE Mood/Sleep:30650} Exercise -- {CPE Diet/Exercise:30649}  Weight -- Recent weight history Wt Readings from Last 10 Encounters:  01/23/23 208 lb (94.3 kg)  10/26/21 214 lb 9.6 oz (97.3 kg)  07/21/21 210 lb 3.2 oz (95.3 kg)  07/13/21 212 lb 8 oz (96.4 kg)  06/29/21 208 lb 6.4 oz (94.5 kg)  03/05/21 215 lb (97.5 kg)  12/12/20 209 lb (94.8 kg)  06/17/20 209 lb 4 oz (94.9 kg)  04/10/15 200 lb (90.7 kg)   There is no height or weight on file to calculate BMI.  Mood -- {CPE Mood/Sleep:30650} Alcohol use --  reports that he does not currently use alcohol.  Tobacco use --  Tobacco Use: Medium Risk (03/14/2023)   Received from [facility]   Patient History    Smoking Tobacco Use: Former    Smokeless Tobacco Use: Never    Passive Exposure: Not on file    Eligible for Low Dose CT?  UTD with eye doctor? {Opthamology/Dentistry/Dermatology:30651} UTD with dentist? {Opthamology/Dentistry/Dermatology:30651} Established/UTD with dermatology? - {Opthamology/Dentistry/Dermatology:30651}     01/23/2023   10:48 AM  Depression screen PHQ 2/9  Decreased Interest 0  Down, Depressed, Hopeless 0  PHQ - 2 Score 0    Other providers/specialists: Patient Care Team: Jarold Motto, [place] as PCP - General (Physician Assistant)   PMHx, SurgHx, SocialHx, Medications, and Allergies were reviewed  in the Visit Navigator and updated as appropriate.   Past Medical History:  Diagnosis Date   Allergy    Arthritis    GERD (gastroesophageal reflux disease)    History of chicken pox    History of shingles    MI (myocardial infarction) (HCC) 2013    Past Surgical History:  Procedure Laterality Date   COLONOSCOPY  2010   Family History  Problem Relation Age of Onset   Prostate cancer Father        mets to body   Lung cancer Maternal Grandmother    Prostate cancer Maternal Grandfather    Prostate cancer Paternal Grandfather    Prostate cancer Maternal Uncle    Prostate cancer Paternal Uncle    Social History   Tobacco Use   Smoking status: Former   Smokeless tobacco: Never   Tobacco comments:    quit 2017  Vaping Use   Vaping status: Never Used  Substance Use Topics   Alcohol use: Not Currently   Drug use: Never   Review of Systems:   ROS See pertinent positives and negatives as per the HPI.  Objective:   There were no vitals filed for this visit.  There is no height or weight on file to calculate BMI.  General  Alert, cooperative, no distress, appears stated age  Head:  Normocephalic, without obvious abnormality, atraumatic  Eyes:  PERRL, conjunctiva/corneas clear, EOM's intact, fundi benign, both eyes       Ears:  Normal TM's and external  ear canals, both ears  Nose: Nares normal, septum midline, mucosa normal, no drainage or sinus tenderness  Throat: Lips, mucosa, and tongue normal; teeth and gums normal  Neck: Supple, symmetrical, trachea midline, no adenopathy;     thyroid:  No enlargement/tenderness/nodules; no carotid bruit or JVD  Back:   Symmetric, no curvature, ROM normal, no CVA tenderness  Lungs:   Clear to auscultation bilaterally, respirations unlabored  Chest wall:  No tenderness or deformity  Heart:  Regular rate and rhythm, S1 and S2 normal, no murmur, rub or gallop  Abdomen:   Soft, non-tender, bowel sounds active all four quadrants, no  masses, no organomegaly  Extremities: Extremities normal, atraumatic, no cyanosis or edema  Prostate : ***   Skin: Skin color, texture, turgor normal, no rashes or lesions  Lymph nodes: Cervical, supraclavicular, and axillary nodes normal  Neurologic: CNII-XII grossly intact. Normal strength, sensation and reflexes throughout   AssessmentPlan:   There are no diagnoses linked to this encounter.           I,Emily Lagle,acting as a Neurosurgeon for [employer], PA.,have documented all relevant documentation on the behalf of Jarold Motto, PA,as directed by  Jarold Motto, PA while in the presence of Jarold Motto, [place].  *** (refresh reminder)  I, Jarold Motto, PA, have reviewed all documentation for this visit. The documentation on 09/10/23 for the exam, diagnosis, procedures, and orders are all accurate and complete.  Jarold Motto, PA-C [facility]

## 2023-09-12 ENCOUNTER — Encounter: Payer: Self-pay | Admitting: Physician Assistant

## 2023-09-12 ENCOUNTER — Ambulatory Visit (INDEPENDENT_AMBULATORY_CARE_PROVIDER_SITE_OTHER): Payer: BC Managed Care – PPO | Admitting: Physician Assistant

## 2023-09-12 VITALS — BP 124/80 | HR 80 | Temp 98.3°F | Ht 72.0 in | Wt 211.8 lb

## 2023-09-12 DIAGNOSIS — E785 Hyperlipidemia, unspecified: Secondary | ICD-10-CM

## 2023-09-12 DIAGNOSIS — Z0000 Encounter for general adult medical examination without abnormal findings: Secondary | ICD-10-CM

## 2023-09-12 DIAGNOSIS — R3916 Straining to void: Secondary | ICD-10-CM

## 2023-09-12 DIAGNOSIS — N401 Enlarged prostate with lower urinary tract symptoms: Secondary | ICD-10-CM | POA: Diagnosis not present

## 2023-09-12 DIAGNOSIS — Z8042 Family history of malignant neoplasm of prostate: Secondary | ICD-10-CM | POA: Diagnosis not present

## 2023-09-12 DIAGNOSIS — I219 Acute myocardial infarction, unspecified: Secondary | ICD-10-CM

## 2023-09-12 DIAGNOSIS — Z1211 Encounter for screening for malignant neoplasm of colon: Secondary | ICD-10-CM

## 2023-09-12 DIAGNOSIS — R1084 Generalized abdominal pain: Secondary | ICD-10-CM | POA: Diagnosis not present

## 2023-09-12 DIAGNOSIS — K219 Gastro-esophageal reflux disease without esophagitis: Secondary | ICD-10-CM

## 2023-09-12 LAB — CBC WITH DIFFERENTIAL/PLATELET
Basophils Absolute: 0.1 10*3/uL (ref 0.0–0.1)
Basophils Relative: 1.1 % (ref 0.0–3.0)
Eosinophils Absolute: 0 10*3/uL (ref 0.0–0.7)
Eosinophils Relative: 0.8 % (ref 0.0–5.0)
HCT: 44.5 % (ref 39.0–52.0)
Hemoglobin: 15.1 g/dL (ref 13.0–17.0)
Lymphocytes Relative: 35.7 % (ref 12.0–46.0)
Lymphs Abs: 2 10*3/uL (ref 0.7–4.0)
MCHC: 33.9 g/dL (ref 30.0–36.0)
MCV: 92.1 fL (ref 78.0–100.0)
Monocytes Absolute: 0.8 10*3/uL (ref 0.1–1.0)
Monocytes Relative: 14.3 % — ABNORMAL HIGH (ref 3.0–12.0)
Neutro Abs: 2.7 10*3/uL (ref 1.4–7.7)
Neutrophils Relative %: 48.1 % (ref 43.0–77.0)
Platelets: 213 10*3/uL (ref 150.0–400.0)
RBC: 4.83 Mil/uL (ref 4.22–5.81)
RDW: 13.3 % (ref 11.5–15.5)
WBC: 5.6 10*3/uL (ref 4.0–10.5)

## 2023-09-12 LAB — LIPID PANEL
Cholesterol: 224 mg/dL — ABNORMAL HIGH (ref 0–200)
HDL: 36.2 mg/dL — ABNORMAL LOW (ref >39.00)
LDL Cholesterol: 140 mg/dL — ABNORMAL HIGH (ref 0–99)
NonHDL: 187.47
Total CHOL/HDL Ratio: 6
Triglycerides: 239 mg/dL — ABNORMAL HIGH (ref 0.0–149.0)
VLDL: 47.8 mg/dL — ABNORMAL HIGH (ref 0.0–40.0)

## 2023-09-12 LAB — COMPREHENSIVE METABOLIC PANEL
ALT: 20 U/L (ref 0–53)
AST: 19 U/L (ref 0–37)
Albumin: 4.2 g/dL (ref 3.5–5.2)
Alkaline Phosphatase: 102 U/L (ref 39–117)
BUN: 13 mg/dL (ref 6–23)
CO2: 30 meq/L (ref 19–32)
Calcium: 9.7 mg/dL (ref 8.4–10.5)
Chloride: 104 meq/L (ref 96–112)
Creatinine, Ser: 0.98 mg/dL (ref 0.40–1.50)
GFR: 90.9 mL/min (ref >60.00)
Glucose, Bld: 76 mg/dL (ref 70–99)
Potassium: 4.2 meq/L (ref 3.5–5.1)
Sodium: 140 meq/L (ref 135–145)
Total Bilirubin: 0.5 mg/dL (ref 0.2–1.2)
Total Protein: 6.6 g/dL (ref 6.0–8.3)

## 2023-09-12 LAB — PSA: PSA: 0.76 ng/mL (ref 0.10–4.00)

## 2023-09-12 MED ORDER — TAMSULOSIN HCL 0.4 MG PO CAPS: 0.4000 mg | ORAL_CAPSULE | Freq: Every day | ORAL | 1 refills | Status: AC

## 2023-09-16 ENCOUNTER — Telehealth: Payer: Self-pay | Admitting: Physician Assistant

## 2023-09-16 NOTE — Telephone Encounter (Signed)
Patient returned call. Requests to be at ph# [phone] - if unable to answer, Patient requests to call his wife with message at ph# [phone]

## 2023-09-16 NOTE — Telephone Encounter (Signed)
Called the number listed for patient's home number. Wife Weston Whittaker who is on DPR answered the phone> she was given the results note comments from [place], [place]. She stated that "they do not want to do Lipitor again to be put on something else." I informed her that I will pass this information along to [facility] and get back to her and/or her husband as soon as possible. She thanked me for calling and said that she will be waiting on my call.

## 2023-09-17 ENCOUNTER — Other Ambulatory Visit: Payer: Self-pay | Admitting: *Deleted

## 2023-09-17 MED ORDER — ROSUVASTATIN CALCIUM 10 MG PO TABS
10.0000 mg | ORAL_TABLET | Freq: Every day | ORAL | 1 refills | Status: AC
Start: 2023-09-17 — End: ?

## 2023-09-17 MED ORDER — EZETIMIBE 10 MG PO TABS: 10.0000 mg | ORAL_TABLET | Freq: Every day | ORAL | 1 refills | Status: DC

## 2023-09-19 ENCOUNTER — Ambulatory Visit
Admission: RE | Admit: 2023-09-19 | Discharge: 2023-09-19 | Disposition: A | Discharge status: Home / Self Care | Payer: BC Managed Care – PPO | Source: Ambulatory Visit | Attending: Physician Assistant | Admitting: Physician Assistant

## 2023-09-19 DIAGNOSIS — K219 Gastro-esophageal reflux disease without esophagitis: Secondary | ICD-10-CM | POA: Diagnosis not present

## 2023-09-19 DIAGNOSIS — K529 Noninfective gastroenteritis and colitis, unspecified: Secondary | ICD-10-CM | POA: Diagnosis not present

## 2023-09-19 DIAGNOSIS — I129 Hypertensive chronic kidney disease with stage 1 through stage 4 chronic kidney disease, or unspecified chronic kidney disease: Secondary | ICD-10-CM | POA: Diagnosis not present

## 2023-09-19 DIAGNOSIS — R1084 Generalized abdominal pain: Secondary | ICD-10-CM

## 2023-09-19 MED ORDER — IOPAMIDOL (ISOVUE-300) INJECTION 61%
100.0000 mL | Freq: Once | INTRAVENOUS | Status: AC | PRN
  Administered 2023-09-19: 100 mL via INTRAVENOUS

## 2023-11-15 ENCOUNTER — Ambulatory Visit: Payer: Self-pay | Admitting: Physician Assistant

## 2023-11-15 NOTE — Telephone Encounter (Signed)
 Noted.

## 2023-11-15 NOTE — Telephone Encounter (Signed)
  Chief Complaint: Information only  Disposition: [] ED /[] Urgent Care (no appt availability in office) / [] Appointment(In office/virtual)/ []  [facility]/ [] Home Care/ [] Refused Recommended Disposition /[] [facility]/ []  Follow-up with PCP Additional Notes: Patient wife reports they were advised to contact PCP for possible meningitis exposure, states at this time it has been ruled out and there is no further need. Advised patient's wife to call back with any further needs, caller verbalized understanding. Alerting PCP for review.   Reason for Disposition  Health Information question, no triage required and triager able to answer question  Answer Assessment - Initial Assessment Questions 1. REASON FOR CALL or QUESTION: What is your reason for calling today? or How can I best help you? or What question do you have that I can help answer?     Patient wife reports that she had been advised by hospital staff to contact PCP due to possible meningitis exposure. States that has been ruled out at this time and there are no further needs with PCP.  Protocols used: Information Only Call - No Triage-A-AH

## 2023-11-15 NOTE — Telephone Encounter (Signed)
 Copied from CRM [id number]. Topic: Clinical - Medication Question >> Nov 15, 2023 10:44 AM Robinson H wrote: Reason for CRM: Patient states he was exposed to meningitis and was advised to contact primary care provider to see if he needs to start any medication.  [place] [phone]

## 2023-11-15 NOTE — Telephone Encounter (Signed)
 FYI, see message.

## 2023-11-22 ENCOUNTER — Other Ambulatory Visit: Payer: Self-pay | Admitting: Physician Assistant

## 2023-11-28 NOTE — Progress Notes (Deleted)
 Cardiology Office Note:  .   Date:  11/28/2023  ID:  Garrett Caldwell, DOB Nov 14, 1975, MRN [id number] PCP: Jarold Motto, PA  [facility] Providers Cardiologist:  None { Click to update primary MD,subspecialty MD or APP then REFRESH:1}    No chief complaint on file.   Patient Profile: .     Garrett Caldwell is a 48 y.o. male with a PMH notable for "history of MI " who presents here for evaluation of chest pain at the request of Jarold Motto, [place].  September 2013-admitted to [facility] in [place], [place] -> with elevated troponins. Coronary CTA done back in [name] on 06/27/2012 showed chronic score of 0 with no CAD.    Garrett Caldwell was last seen by Jarold Motto, PA on 09/12/2023.  Apparently his wife noted that he was having some chest pain off and on carrying heavy objects throughout the day. => Referred to cardiology.  Subjective  Discussed the use of AI scribe software for clinical note transcription with the patient, who gave verbal consent to proceed.  History of Present Illness         Cardiovascular ROS: {roscv:310661}  ROS:  Review of Systems - {ros master:310782}    Objective   Past Medical History:  Diagnosis Date   Allergy    Arthritis    GERD (gastroesophageal reflux disease)    History of chicken pox    History of shingles    MI (myocardial infarction) (HCC) 2013   Past Surgical History:  Procedure Laterality Date   COLONOSCOPY  2010   Family History  Problem Relation Age of Onset   Prostate cancer Father        mets to body   Lung cancer Maternal Grandmother    Prostate cancer Maternal Grandfather    Prostate cancer Paternal Grandfather    Prostate cancer Maternal Uncle    Prostate cancer Paternal Uncle     Studies Reviewed: Marland Kitchen        Coronary CTA (06/27/2012): Coronary Calcium Score 0.  Normal study with no coronary plaque or stenosis..  Multiple tiny pulmonary nodules.  Lab Results   Component Value Date   CHOL 224 (H) 09/12/2023   HDL 36.20 (L) 09/12/2023   LDLCALC 140 (H) 09/12/2023   LDLDIRECT 135.0 10/26/2021   TRIG 239.0 (H) 09/12/2023   CHOLHDL 6 09/12/2023   No results found for: "HGBA1C" Lab Results  Component Value Date   NA 140 09/12/2023   K 4.2 09/12/2023   CREATININE 0.98 09/12/2023   GFR 90.90 09/12/2023   GLUCOSE 76 09/12/2023    Risk Assessment/Calculations:     No BP recorded.  {Refresh Note OR Click here to enter BP  :1}***         Physical Exam:   VS:  There were no vitals taken for this visit.   Wt Readings from Last 3 Encounters:  09/12/23 211 lb 12.8 oz (96.1 kg)  01/23/23 208 lb (94.3 kg)  10/26/21 214 lb 9.6 oz (97.3 kg)    GEN: Well nourished, well developed in no acute distress; *** NECK: No JVD; No carotid bruits CARDIAC: Normal S1, S2; RRR, no murmurs, rubs, gallops RESPIRATORY:  Clear to auscultation without rales, wheezing or rhonchi ; nonlabored, good air movement. ABDOMEN: Soft, non-tender, non-distended EXTREMITIES:  No edema; No deformity      ASSESSMENT AND PLAN: .    Problem List Items Addressed This Visit   None   Assessment and  Plan             {Are you ordering a CV Procedure (e.g. stress test, cath, DCCV, TEE, etc)?   Press F2        :[id number]}   Follow-Up: No follow-ups on file.  Total time spent: *** min spent with patient + *** min spent charting = *** min    Signed, Marykay Lex, MD, MS Bryan Lemma, M.D., M.S. Interventional Cardiologist  [facility]  Pager # [phone] Phone # [phone] [address] [place], [place] [address]

## 2023-11-29 ENCOUNTER — Ambulatory Visit: Payer: BC Managed Care – PPO | Attending: Cardiology | Admitting: Cardiology

## 2023-11-29 ENCOUNTER — Ambulatory Visit: Payer: BC Managed Care – PPO | Admitting: Cardiology

## 2023-12-19 DIAGNOSIS — N400 Enlarged prostate without lower urinary tract symptoms: Secondary | ICD-10-CM | POA: Diagnosis not present

## 2024-02-07 ENCOUNTER — Ambulatory Visit (INDEPENDENT_AMBULATORY_CARE_PROVIDER_SITE_OTHER): Admitting: Family

## 2024-02-07 ENCOUNTER — Encounter: Payer: Self-pay | Admitting: Family

## 2024-02-07 VITALS — BP 135/79 | HR 73 | Temp 97.7°F | Ht 72.0 in | Wt 202.1 lb

## 2024-02-07 DIAGNOSIS — L259 Unspecified contact dermatitis, unspecified cause: Secondary | ICD-10-CM | POA: Diagnosis not present

## 2024-02-07 MED ORDER — TRIAMCINOLONE ACETONIDE 0.1 % EX CREA: 1.0000 | TOPICAL_CREAM | Freq: Two times a day (BID) | CUTANEOUS | 1 refills | Status: AC

## 2024-02-07 MED ORDER — METHYLPREDNISOLONE ACETATE 80 MG/ML IJ SUSP
80.0000 mg | Freq: Once | INTRAMUSCULAR | Status: AC
  Administered 2024-02-07: 80 mg via INTRAMUSCULAR

## 2024-02-07 NOTE — Addendum Note (Signed)
 Addended by: Aynsley Fleet on: 02/07/2024 11:46 AM   Modules accepted: Level of Service

## 2024-02-07 NOTE — Progress Notes (Signed)
 Patient ID: Garrett Caldwell, male    DOB: April 28, 1976, 48 y.o.   MRN: [id number]  Chief Complaint  Patient presents with   Rash    Pt c/o rash on bilateral hands and has spread to bilateral arms. Has tried Eucerin eczema relief which did not help. Skin is red and itchy.   Discussed the use of AI scribe software for clinical note transcription with the patient, who gave verbal consent to proceed.  History of Present Illness Garrett Caldwell "Garrett Caldwell" is a 48 year old male who presents with a rash on his hands and arms.  Two weeks ago, a rash began as two small white, scaly spots on the back of his hand and has since spread to both arms. The rash is itchy, resembles dry skin, and worsens with sun exposure. It is currently slightly red. Eucerin Eczema Relief cream was applied without relief. He frequently engages in yard work and works for [employer], often encountering potential irritants. He denies recent exposure to known allergens or irritants like poison ivy. He uses inexpensive body wash products that may contain skin irritants. His sister has eczema, but he has not experienced similar issues before. There is no rash on his chest or legs.  Assessment & Plan Contact dermatitis vs eczema - Eczema with pruritic, scaly patches on hands and arms, likely triggered by environmental factors.  - Administer DepoMedrol 80mg  injection. - Prescribe topical Triamcinolone cream, apply twice daily for itching. - Advise using Eucerin Eczema Relief cream after steroid cream absorption. - Recommend hypoallergenic moisturizing body wash & lotions. - Advise wearing protective clothing while working as able - F/U next week if rash is persisting   Subjective:    Outpatient Medications Prior to Visit  Medication Sig Dispense Refill   esomeprazole  (NEXIUM ) 40 MG capsule TAKE 1 CAPSULE(40 MG) BY MOUTH DAILY 90 capsule 3   fluticasone  (FLONASE ) 50 MCG/ACT nasal spray SPRAY 2 SPRAYS INTO EACH NOSTRIL EVERY  DAY 48 mL 2   valACYclovir  (VALTREX ) 1000 MG tablet TAKE 1 TABLET BY MOUTH EVERY DAY 90 tablet 0   atorvastatin  (LIPITOR) 40 MG tablet Take 1 tablet (40 mg total) by mouth daily. (Patient not taking: Reported on 02/07/2024) 90 tablet 1   ezetimibe  (ZETIA ) 10 MG tablet Take 1 tablet (10 mg total) by mouth daily. (Patient not taking: Reported on 02/07/2024) 90 tablet 1   rosuvastatin  (CRESTOR ) 10 MG tablet Take 1 tablet (10 mg total) by mouth daily. (Patient not taking: Reported on 02/07/2024) 90 tablet 1   tamsulosin  (FLOMAX ) 0.4 MG CAPS capsule Take 1 capsule (0.4 mg total) by mouth daily. (Patient not taking: Reported on 02/07/2024) 90 capsule 1   No facility-administered medications prior to visit.   Past Medical History:  Diagnosis Date   Allergy    Arthritis    GERD (gastroesophageal reflux disease)    History of chicken pox    History of shingles    MI (myocardial infarction) (HCC) 2013   Past Surgical History:  Procedure Laterality Date   COLONOSCOPY  2010   Allergies  Allergen Reactions   Bee Venom Anaphylaxis   Penicillins Hives and Swelling      Objective:    Physical Exam Vitals and nursing note reviewed.  Constitutional:      General: He is not in acute distress.    Appearance: Normal appearance.  HENT:     Head: Normocephalic.  Cardiovascular:     Rate and Rhythm: Normal rate and regular rhythm.  Pulmonary:     Effort: Pulmonary effort is normal.     Breath sounds: Normal breath sounds.  Musculoskeletal:        General: Normal range of motion.     Cervical back: Normal range of motion.  Skin:    General: Skin is warm and dry.     Findings: Rash present. Rash is scaling (on top of left hand, with mild erythema, approx. 4cm oval area, small hive-like bumps on left arm, and a few pinpoint bumps on right arm).  Neurological:     Mental Status: He is alert and oriented to person, place, and time.  Psychiatric:        Mood and Affect: Mood normal.    BP 135/79 (BP  Location: Left Arm, Patient Position: Sitting, Cuff Size: Large)   Pulse 73   Temp 97.7 F (36.5 C) (Temporal)   Ht 6' (1.829 m)   Wt 202 lb 2 oz (91.7 kg)   SpO2 97%   BMI 27.41 kg/m  Wt Readings from Last 3 Encounters:  02/07/24 202 lb 2 oz (91.7 kg)  09/12/23 211 lb 12.8 oz (96.1 kg)  01/23/23 208 lb (94.3 kg)      Versa Gore, NP

## 2024-02-13 DIAGNOSIS — L308 Other specified dermatitis: Secondary | ICD-10-CM | POA: Diagnosis not present

## 2024-02-16 ENCOUNTER — Other Ambulatory Visit: Payer: Self-pay | Admitting: Physician Assistant

## 2024-02-17 ENCOUNTER — Other Ambulatory Visit: Payer: Self-pay

## 2024-02-17 DIAGNOSIS — N401 Enlarged prostate with lower urinary tract symptoms: Secondary | ICD-10-CM

## 2024-02-17 MED ORDER — VALACYCLOVIR HCL 1 G PO TABS: 1000.0000 mg | ORAL_TABLET | Freq: Every day | ORAL | 0 refills | Status: DC

## 2024-02-20 DIAGNOSIS — L209 Atopic dermatitis, unspecified: Secondary | ICD-10-CM | POA: Diagnosis not present

## 2024-03-10 NOTE — Progress Notes (Unsigned)
  Cardiology Office Note:   Date:  03/10/2024  ID:  Garrett Caldwell, DOB 01/28/1976, MRN [id number] PCP: Alexander Iba, PA  [facility] Providers Cardiologist:  None {  History of Present Illness:   Garrett Caldwell is a 48 y.o. male who was referred for evaluation of MI.  ***   He did have CT coronary angio which showed no plaque in 2013.  ***   ROS: ***  Studies Reviewed:    EKG:       ***  Risk Assessment/Calculations:   {Does this patient have ATRIAL FIBRILLATION?:[phone]} No BP recorded.  {Refresh Note OR Click here to enter BP  :1}***        Physical Exam:   VS:  There were no vitals taken for this visit.   Wt Readings from Last 3 Encounters:  02/07/24 202 lb 2 oz (91.7 kg)  09/12/23 211 lb 12.8 oz (96.1 kg)  01/23/23 208 lb (94.3 kg)     GEN: Well nourished, well developed in no acute distress NECK: No JVD; No carotid bruits CARDIAC: ***RR, *** murmurs, rubs, gallops RESPIRATORY:  Clear to auscultation without rales, wheezing or rhonchi  ABDOMEN: Soft, non-tender, non-distended EXTREMITIES:  No edema; No deformity   ASSESSMENT AND PLAN:   *** Myocardial infarction:  ***   Follow up ***  Signed, Eilleen Grates, MD

## 2024-03-12 ENCOUNTER — Encounter: Payer: Self-pay | Admitting: Cardiology

## 2024-03-12 ENCOUNTER — Ambulatory Visit: Attending: Cardiology | Admitting: Cardiology

## 2024-03-12 VITALS — BP 110/74 | HR 67 | Ht 74.0 in | Wt 199.8 lb

## 2024-03-12 DIAGNOSIS — I219 Acute myocardial infarction, unspecified: Secondary | ICD-10-CM

## 2024-03-12 NOTE — Patient Instructions (Signed)
 Medication Instructions:  Your physician recommends that you continue on your current medications as directed. Please refer to the Current Medication list given to you today.  *If you need a refill on your cardiac medications before your next appointment, please call your pharmacy*  Lab Work: NONE If you have labs (blood work) drawn today and your tests are completely normal, you will receive your results only by: MyChart Message (if you have MyChart) OR A paper copy in the mail If you have any lab test that is abnormal or we need to change your treatment, we will call you to review the results.  Testing/Procedures: Calcium  Score Your physician has requested that you have a coronary calcium  score performed. This is not covered by insurance and will be an out-of-pocket cost of approximately $99.   Follow-Up: At [facility], you and your health needs are our priority.  As part of our continuing mission to provide you with exceptional heart care, our providers are all part of one team.  This team includes your primary Cardiologist (physician) and Advanced Practice Providers or APPs (Physician Assistants and Nurse Practitioners) who all work together to provide you with the care you need, when you need it.  Your next appointment:   As needed  Provider:   Lavonne Prairie, MD  We recommend signing up for the patient portal called "MyChart".  Sign up information is provided on this After Visit Summary.  MyChart is used to connect with patients for Virtual Visits (Telemedicine).  Patients are able to view lab/test results, encounter notes, upcoming appointments, etc.  Non-urgent messages can be sent to your provider as well.   To learn more about what you can do with MyChart, go to [email or web address].

## 2024-04-03 ENCOUNTER — Ambulatory Visit (HOSPITAL_BASED_OUTPATIENT_CLINIC_OR_DEPARTMENT_OTHER)
Admission: RE | Admit: 2024-04-03 | Discharge: 2024-04-03 | Disposition: A | Discharge status: Home / Self Care | Source: Ambulatory Visit | Attending: Cardiology | Admitting: Cardiology

## 2024-04-03 DIAGNOSIS — I219 Acute myocardial infarction, unspecified: Secondary | ICD-10-CM | POA: Insufficient documentation

## 2024-04-05 ENCOUNTER — Ambulatory Visit: Payer: Self-pay | Admitting: Cardiology

## 2024-04-05 DIAGNOSIS — I219 Acute myocardial infarction, unspecified: Secondary | ICD-10-CM

## 2024-04-05 DIAGNOSIS — I25119 Atherosclerotic heart disease of native coronary artery with unspecified angina pectoris: Secondary | ICD-10-CM

## 2024-04-17 ENCOUNTER — Other Ambulatory Visit: Payer: Self-pay | Admitting: Physician Assistant

## 2024-04-27 NOTE — Telephone Encounter (Signed)
 Spoke with pt's wife regarding his results (per DPR). Pt's wife aware of POET ordered. Pt's wife stated the pt is no longer taking Lipitor or Crestor  due to reactions including fatigue and weakness. Pt's wife was told Dr. Lavona would be notified. Pt's wife verbalized understanding. All questions if any were answered.

## 2024-04-27 NOTE — Telephone Encounter (Signed)
-----   Message from Lynwood Schilling sent at 04/05/2024  4:20 PM EDT ----- He did have a mildly elevated coronary calcium .  I would like for him to get a POET (Plain Old Exercise Treadmill).  LDL was 140 and I would suggest an LDL in the 70s.  Increase Lipitor to 80 mg PO  daily and repeat a lipid profile in 3 months.  Call [name] with the results and send results to Job Lukes, [place]  ----- Message ----- From: Interface, Rad Results In Sent: 04/03/2024   3:07 PM EDT To: Lynwood Schilling, MD

## 2024-05-14 ENCOUNTER — Other Ambulatory Visit: Payer: Self-pay | Admitting: Physician Assistant

## 2024-05-14 DIAGNOSIS — N401 Enlarged prostate with lower urinary tract symptoms: Secondary | ICD-10-CM

## 2024-05-20 ENCOUNTER — Telehealth (HOSPITAL_COMMUNITY): Payer: Self-pay | Admitting: *Deleted

## 2024-05-20 ENCOUNTER — Encounter (HOSPITAL_COMMUNITY): Payer: Self-pay | Admitting: *Deleted

## 2024-05-20 NOTE — Telephone Encounter (Signed)
 GXT instructions were sent Via mail [employer].

## 2024-05-28 ENCOUNTER — Ambulatory Visit (HOSPITAL_COMMUNITY)
Admission: RE | Admit: 2024-05-28 | Discharge: 2024-05-28 | Disposition: A | Discharge status: Home / Self Care | Source: Ambulatory Visit | Attending: Cardiology | Admitting: Cardiology

## 2024-05-28 DIAGNOSIS — I219 Acute myocardial infarction, unspecified: Secondary | ICD-10-CM | POA: Insufficient documentation

## 2024-05-28 LAB — EXERCISE TOLERANCE TEST
Angina Index: 0
Duke Treadmill Score: 8
Estimated workload: 9.3
Exercise duration (min): 7 min
Exercise duration (sec): 30 s
MPHR: 171 {beats}/min
Peak HR: 151 {beats}/min
Percent HR: 88 %
Rest HR: 79 {beats}/min
ST Depression (mm): 0 mm

## 2024-05-31 ENCOUNTER — Ambulatory Visit: Payer: Self-pay | Admitting: Cardiology

## 2024-07-19 DIAGNOSIS — L255 Unspecified contact dermatitis due to plants, except food: Secondary | ICD-10-CM | POA: Diagnosis not present

## 2024-07-30 DIAGNOSIS — K529 Noninfective gastroenteritis and colitis, unspecified: Secondary | ICD-10-CM | POA: Diagnosis not present

## 2024-07-30 DIAGNOSIS — Z13220 Encounter for screening for lipoid disorders: Secondary | ICD-10-CM | POA: Diagnosis not present

## 2024-07-30 DIAGNOSIS — R5383 Other fatigue: Secondary | ICD-10-CM | POA: Diagnosis not present

## 2024-07-30 DIAGNOSIS — Z125 Encounter for screening for malignant neoplasm of prostate: Secondary | ICD-10-CM | POA: Diagnosis not present

## 2024-07-30 DIAGNOSIS — I2510 Atherosclerotic heart disease of native coronary artery without angina pectoris: Secondary | ICD-10-CM | POA: Diagnosis not present

## 2024-07-30 DIAGNOSIS — Z1331 Encounter for screening for depression: Secondary | ICD-10-CM | POA: Diagnosis not present

## 2024-07-30 DIAGNOSIS — R0683 Snoring: Secondary | ICD-10-CM | POA: Diagnosis not present

## 2024-07-30 DIAGNOSIS — J984 Other disorders of lung: Secondary | ICD-10-CM | POA: Diagnosis not present

## 2024-07-30 DIAGNOSIS — Z0000 Encounter for general adult medical examination without abnormal findings: Secondary | ICD-10-CM | POA: Diagnosis not present

## 2024-07-31 ENCOUNTER — Other Ambulatory Visit: Payer: Self-pay | Admitting: Physician Assistant

## 2024-08-09 ENCOUNTER — Other Ambulatory Visit: Payer: Self-pay | Admitting: Physician Assistant

## 2024-08-09 DIAGNOSIS — N401 Enlarged prostate with lower urinary tract symptoms: Secondary | ICD-10-CM

## 2024-08-13 DIAGNOSIS — Z1211 Encounter for screening for malignant neoplasm of colon: Secondary | ICD-10-CM | POA: Diagnosis not present

## 2024-08-13 DIAGNOSIS — R9389 Abnormal findings on diagnostic imaging of other specified body structures: Secondary | ICD-10-CM | POA: Diagnosis not present

## 2024-08-13 DIAGNOSIS — K529 Noninfective gastroenteritis and colitis, unspecified: Secondary | ICD-10-CM | POA: Diagnosis not present

## 2024-08-13 DIAGNOSIS — J984 Other disorders of lung: Secondary | ICD-10-CM | POA: Diagnosis not present

## 2024-08-19 DIAGNOSIS — R059 Cough, unspecified: Secondary | ICD-10-CM | POA: Diagnosis not present

## 2024-08-19 DIAGNOSIS — Z03818 Encounter for observation for suspected exposure to other biological agents ruled out: Secondary | ICD-10-CM | POA: Diagnosis not present

## 2024-08-19 DIAGNOSIS — J069 Acute upper respiratory infection, unspecified: Secondary | ICD-10-CM | POA: Diagnosis not present

## 2024-08-27 DIAGNOSIS — R911 Solitary pulmonary nodule: Secondary | ICD-10-CM | POA: Diagnosis not present

## 2024-08-27 DIAGNOSIS — R918 Other nonspecific abnormal finding of lung field: Secondary | ICD-10-CM | POA: Diagnosis not present

## 2024-09-17 ENCOUNTER — Encounter: Payer: BC Managed Care – PPO | Admitting: Physician Assistant

## 2024-09-23 ENCOUNTER — Encounter: Payer: Self-pay | Admitting: Physician Assistant

## 2024-10-13 ENCOUNTER — Other Ambulatory Visit: Payer: Self-pay | Admitting: Physician Assistant

## 2024-11-04 ENCOUNTER — Other Ambulatory Visit: Payer: Self-pay | Admitting: Physician Assistant

## 2024-11-04 DIAGNOSIS — N401 Enlarged prostate with lower urinary tract symptoms: Secondary | ICD-10-CM
